# Patient Record
Sex: Male | Born: 1955 | Race: Black or African American | Hispanic: No | Marital: Married | Smoking: Current every day smoker
Health system: Southern US, Community
[De-identification: ages and names within clinical notes are randomized; demographics above are authoritative.]

## PROBLEM LIST (undated history)

## (undated) DIAGNOSIS — M2550 Pain in unspecified joint: Secondary | ICD-10-CM

## (undated) DIAGNOSIS — M1990 Unspecified osteoarthritis, unspecified site: Secondary | ICD-10-CM

## (undated) DIAGNOSIS — G4700 Insomnia, unspecified: Secondary | ICD-10-CM

## (undated) DIAGNOSIS — G8929 Other chronic pain: Secondary | ICD-10-CM

## (undated) DIAGNOSIS — N400 Enlarged prostate without lower urinary tract symptoms: Secondary | ICD-10-CM

## (undated) DIAGNOSIS — I10 Essential (primary) hypertension: Secondary | ICD-10-CM

## (undated) DIAGNOSIS — K219 Gastro-esophageal reflux disease without esophagitis: Secondary | ICD-10-CM

## (undated) DIAGNOSIS — M549 Dorsalgia, unspecified: Secondary | ICD-10-CM

## (undated) DIAGNOSIS — R351 Nocturia: Secondary | ICD-10-CM

## (undated) DIAGNOSIS — T4145XA Adverse effect of unspecified anesthetic, initial encounter: Secondary | ICD-10-CM

## (undated) DIAGNOSIS — T8859XA Other complications of anesthesia, initial encounter: Secondary | ICD-10-CM

## (undated) HISTORY — PX: BACK SURGERY: SHX140

## (undated) HISTORY — PX: HEMORRHOID SURGERY: SHX153

## (undated) HISTORY — PX: TONSILLECTOMY: SUR1361

## (undated) HISTORY — PX: COLONOSCOPY: SHX174

---

## 2009-09-17 ENCOUNTER — Emergency Department (HOSPITAL_COMMUNITY): Admission: EM | Admit: 2009-09-17 | Discharge: 2009-09-18 | Payer: Self-pay | Admitting: Emergency Medicine

## 2009-12-11 ENCOUNTER — Emergency Department (HOSPITAL_COMMUNITY): Admission: EM | Admit: 2009-12-11 | Discharge: 2009-12-11 | Payer: Self-pay | Admitting: Emergency Medicine

## 2010-07-08 LAB — URINE MICROSCOPIC-ADD ON

## 2010-07-08 LAB — URINE CULTURE
Colony Count: NO GROWTH
Culture  Setup Time: 201108211832
Culture: NO GROWTH

## 2010-07-08 LAB — URINALYSIS, ROUTINE W REFLEX MICROSCOPIC
Bilirubin Urine: NEGATIVE
Nitrite: NEGATIVE
Protein, ur: NEGATIVE mg/dL
Specific Gravity, Urine: 1.008 (ref 1.005–1.030)
pH: 6 (ref 5.0–8.0)

## 2010-07-17 ENCOUNTER — Emergency Department (HOSPITAL_COMMUNITY)
Admission: EM | Admit: 2010-07-17 | Discharge: 2010-07-17 | Disposition: A | Discharge status: Home / Self Care | Payer: Self-pay | Attending: Emergency Medicine | Admitting: Emergency Medicine

## 2010-07-17 DIAGNOSIS — K089 Disorder of teeth and supporting structures, unspecified: Secondary | ICD-10-CM | POA: Insufficient documentation

## 2010-07-17 DIAGNOSIS — R220 Localized swelling, mass and lump, head: Secondary | ICD-10-CM | POA: Insufficient documentation

## 2010-07-17 DIAGNOSIS — R21 Rash and other nonspecific skin eruption: Secondary | ICD-10-CM | POA: Insufficient documentation

## 2011-06-11 ENCOUNTER — Emergency Department (HOSPITAL_COMMUNITY)
Admission: EM | Admit: 2011-06-11 | Discharge: 2011-06-11 | Disposition: A | Discharge status: Home / Self Care | Payer: Self-pay | Attending: Emergency Medicine | Admitting: Emergency Medicine

## 2011-06-11 ENCOUNTER — Emergency Department (HOSPITAL_COMMUNITY): Payer: Self-pay

## 2011-06-11 ENCOUNTER — Encounter (HOSPITAL_COMMUNITY): Payer: Self-pay | Admitting: *Deleted

## 2011-06-11 DIAGNOSIS — F17200 Nicotine dependence, unspecified, uncomplicated: Secondary | ICD-10-CM | POA: Insufficient documentation

## 2011-06-11 DIAGNOSIS — M549 Dorsalgia, unspecified: Secondary | ICD-10-CM | POA: Insufficient documentation

## 2011-06-11 DIAGNOSIS — R209 Unspecified disturbances of skin sensation: Secondary | ICD-10-CM | POA: Insufficient documentation

## 2011-06-11 DIAGNOSIS — M5126 Other intervertebral disc displacement, lumbar region: Secondary | ICD-10-CM | POA: Insufficient documentation

## 2011-06-11 HISTORY — DX: Dorsalgia, unspecified: M54.9

## 2011-06-11 HISTORY — DX: Other chronic pain: G89.29

## 2011-06-11 MED ORDER — DIAZEPAM 5 MG/ML IJ SOLN
5.0000 mg | Freq: Once | INTRAMUSCULAR | Status: AC
  Administered 2011-06-11: 5 mg via INTRAMUSCULAR
  Filled 2011-06-11: qty 2

## 2011-06-11 MED ORDER — OXYCODONE-ACETAMINOPHEN 5-325 MG PO TABS: 1.0000 | ORAL_TABLET | ORAL | Status: AC | PRN

## 2011-06-11 MED ORDER — PREDNISONE 10 MG PO TABS: 40.0000 mg | ORAL_TABLET | Freq: Every day | ORAL | Status: DC

## 2011-06-11 MED ORDER — DIAZEPAM 5 MG PO TABS: 5.0000 mg | ORAL_TABLET | Freq: Two times a day (BID) | ORAL | Status: AC

## 2011-06-11 MED ORDER — DEXAMETHASONE SODIUM PHOSPHATE 10 MG/ML IJ SOLN
10.0000 mg | Freq: Once | INTRAMUSCULAR | Status: AC
  Administered 2011-06-11: 10 mg via INTRAMUSCULAR
  Filled 2011-06-11: qty 1

## 2011-06-11 MED ORDER — HYDROMORPHONE HCL PF 2 MG/ML IJ SOLN
2.0000 mg | Freq: Once | INTRAMUSCULAR | Status: AC
  Administered 2011-06-11: 2 mg via INTRAMUSCULAR
  Filled 2011-06-11: qty 1

## 2011-06-11 NOTE — ED Provider Notes (Signed)
Medical screening examination/treatment/procedure(s) were performed by non-physician practitioner and as supervising physician I was immediately available for consultation/collaboration.   Vida Roller, MD 06/11/11 1539

## 2011-06-11 NOTE — ED Notes (Signed)
Pt c/o chronic back pain, exacerbated by moving heavy furniture today. Pt presents w/ uncontrolled low back pain x 3 hrs.

## 2011-06-11 NOTE — ED Provider Notes (Signed)
History     CSN: [id number]  Arrival date & time 06/11/11  0114   First MD Initiated Contact with Patient 06/11/11 0121      Chief Complaint  Patient presents with  . Back Pain    (Consider location/radiation/quality/duration/timing/severity/associated sxs/prior treatment) HPI Comments: Patient here with lower back pain - states that he was moving furniture about 2 days ago - states that the pain started soon after this - states has prior history of lower back pain periodically in the past - denies any surgeries or interventions with this - reports pain radiates from left lower back into left thigh and stops at the knee - denies weakness but reports decrease in movement due to the pain - denies numbness but reports mild tingling to left thigh - denies loss of control of bowels or bladder   Patient is a 56 y.o. male presenting with back pain. The history is provided by the patient. No language interpreter was used.  Back Pain  This is a recurrent problem. The current episode started 2 days ago. The problem occurs constantly. The problem has not changed since onset.The pain is associated with lifting heavy objects. The pain is present in the lumbar spine. The quality of the pain is described as shooting. The pain radiates to the left thigh. The pain is at a severity of 10/10. The pain is severe. The symptoms are aggravated by bending and twisting. The pain is the same all the time. Stiffness is present all day. Associated symptoms include leg pain and tingling. Pertinent negatives include no chest pain, no fever, no numbness, no weight loss, no headaches, no abdominal pain, no abdominal swelling, no bowel incontinence, no perianal numbness, no bladder incontinence, no dysuria, no pelvic pain, no paresthesias, no paresis and no weakness. He has tried NSAIDs for the symptoms. The treatment provided no relief.    Past Medical History  Diagnosis Date  . Chronic back pain greater than 3 months  duration     Past Surgical History  Procedure Date  . Hemorrhoid surgery   . Tonsillectomy     History reviewed. No pertinent family history.  History  Substance Use Topics  . Smoking status: Current Everyday Smoker -- 0.5 packs/day    Types: Cigarettes  . Smokeless tobacco: Not on file  . Alcohol Use: Yes     occasionally      Review of Systems  Constitutional: Negative for fever and weight loss.  Cardiovascular: Negative for chest pain.  Gastrointestinal: Negative for abdominal pain and bowel incontinence.  Genitourinary: Negative for bladder incontinence, dysuria and pelvic pain.  Musculoskeletal: Positive for back pain.  Neurological: Positive for tingling. Negative for weakness, numbness, headaches and paresthesias.  All other systems reviewed and are negative.    Allergies  Review of patient's allergies indicates no known allergies.  Home Medications  No current outpatient prescriptions on file.  BP 144/89  Pulse 91  Temp(Src) 98.8 F (37.1 C) (Oral)  Resp 24  Ht 6\' 3"  (1.905 m)  Wt 275 lb (124.739 kg)  BMI 34.37 kg/m2  SpO2 100%  Physical Exam  Nursing note and vitals reviewed. Constitutional: He is oriented to person, place, and time. He appears well-developed and well-nourished. No distress.  HENT:  Head: Normocephalic and atraumatic.  Right Ear: External ear normal.  Left Ear: External ear normal.  Nose: Nose normal.  Mouth/Throat: Oropharynx is clear and moist. No oropharyngeal exudate.  Eyes: Conjunctivae are normal. Pupils are equal, round, and reactive to  light. No scleral icterus.  Neck: Normal range of motion. Neck supple.  Cardiovascular: Normal rate, regular rhythm and normal heart sounds.  Exam reveals no gallop and no friction rub.   No murmur heard. Pulmonary/Chest: Effort normal and breath sounds normal. No respiratory distress. He exhibits no tenderness.  Abdominal: Soft. Bowel sounds are normal. He exhibits no distension. There is  no tenderness.  Musculoskeletal:       Lumbar back: He exhibits decreased range of motion, tenderness and bony tenderness. He exhibits no deformity.       Back:  Lymphadenopathy:    He has no cervical adenopathy.  Neurological: He is alert and oriented to person, place, and time. He displays normal reflexes. No cranial nerve deficit. He exhibits normal muscle tone. Coordination normal.  Skin: Skin is warm and dry. No rash noted. No erythema. No pallor.  Psychiatric: He has a normal mood and affect. His behavior is normal. Judgment and thought content normal.    ED Course  Procedures (including critical care time)  Labs Reviewed - No data to display Ct Lumbar Spine Wo Contrast  06/11/2011  *RADIOLOGY REPORT*  Clinical Data: Chronic lower back pain, significantly worsened for past 3 hours.  CT LUMBAR SPINE WITHOUT CONTRAST  Technique:  Multidetector CT imaging of the lumbar spine was performed without intravenous contrast administration. Multiplanar CT image reconstructions were also generated.  Comparison: None.  Findings: There is no evidence of fracture or subluxation along the lumbar spine.  Vertebral bodies demonstrate normal height and alignment.  Intervertebral disc spaces are preserved. Facet disease is noted along the lower lumbar spine.  Mild circumferential disc bulges are noted at L3-L4, L4-L5 and L5- S1.  These result in mild partial effacement of the foraminal fat bilaterally at these levels, without evidence of impression on the exiting nerve roots.  There is mild bony foraminal narrowing on the left side at L4-L5, which may slightly impress on the exiting nerve root.  Suggest correlation for associated symptoms.  Mild scattered vascular calcifications are seen.  No significant soft tissue abnormalities are identified.  IMPRESSION:  1.  No evidence of fracture or subluxation along the lumbar spine. 2.  Mild circumferential disc bulges noted along the lower lumbar spine, without evidence  of impression on exiting nerve roots. 3.  Mild bony foraminal narrowing on the left side at L4-L5, which may slightly impress on the exiting nerve root.  Suggest correlation for associated symptoms.  Original Report Authenticated By: Tonia Ghent, M.D.     Herniated disc    MDM  CT reveals multilevel mild disc bulges without compression on nerve roots - however there is mild bony foraminal narrowing on the left side at L4-5 which may be causing compression on the exiting left nerve root consistent with the patient's symptoms.  Plan to continue on steroids, pain control and have the patient follow up with Dr. Wynetta Emery with [facility] on an outpatient basis.        Izola Price [place], [place] 06/11/11 [phone]

## 2011-06-11 NOTE — Discharge Instructions (Signed)
Herniated Disk The bones of your spinal column (vertebrae) protect your spinal cord and nerves that go into your arms and legs. The vertebrae are separated by disks that cushion the spinal column and put space between your vertebrae. This allows movement between the vertebrae, which allows you to bend, rotate, and move your body from side to side. Sometimes, the disks move out of place (herniate) or break open (rupture) from injury or strain. The most common area for a disk herniation is in the lower back (lumbar area). Sometimes herniation occurs in the neck (cervical) disks.  CAUSES  As we grow older, the strong, fibrous cords that connect the vertebrae and support and surround the disks (ligaments) start to weaken. A strain on the back may cause a break in the disk ligaments. RISK FACTORS Herniated disks occur most often in men who are aged 18 years to 35 years, usually after strenuous activity. Other risk factors include conditions present at birth (congenital) that affect the size of the lumbar spinal canal. Additionally, a narrowing of the areas where the nerves exit the spinal canal can occur as you age. SYMPTOMS  Symptoms of a herniated disk vary. You may have weakness in certain muscles. This weakness can include difficulty lifting your leg or arm, difficulty standing on your toes on one side, or difficulty squeezing tightly with one of your hands. You may have numbness. You may feel a mild tingling, dull ache, or a burning or pulsating pain. In some cases, the pain is severe enough that you are unable to move. The pain most often occurs on one side of the body. The pain often starts slowly. It may get worse:  After you sit or stand.   At night.   When you sneeze, cough, or laugh.   When you bend backwards or walk more than a few yards.  The pain, numbness, or weakness will often go away or improve a lot over a period of weeks to months. Herniated lumbar disk Symptoms of a herniated  lumbar disk may include sharp pain in one part of your leg, hip, or buttocks and numbness in other parts. You also may feel pain or numbness on the back of your calf or the top or sole of your foot. The same leg also may feel weak. Herniated cervical disk Symptoms of a herniated cervical disk may include pain when you move your neck, deep pain near or over your shoulder blade, or pain that moves to your upper arm, forearm, or fingers. DIAGNOSIS  To diagnose a herniated disk, your caregiver will perform a physical exam. Your caregiver also may perform diagnostic tests to see your disk or to test the reaction of your muscles and the function of your nerves. During the physical exam, your caregiver may ask you to:  Sit, stand, and walk. While you walk, your caregiver may ask you to try walking on your toes and then your heels.   Bend forward, backward, and sideways.   Raise your shoulders, elbow, wrist, and fingers and check your strength during these tasks.  Your caregiver will check for:  Numbness or loss of feeling.   Muscle reflexes, which may be slower or missing.   Muscle strength, which may be weaker.   Posture or the way your spine curves.  Diagnostic tests that may be done include:  A spinal X-ray exam to rule out other causes of back pain.   Magnetic resonance imaging (MRI) or computed tomography (CT) scan, which will show   if the herniated disk is pressing on your spinal canal.   Electromyography. This is sometimes used to identify the specific area of nerve involvement.  TREATMENT  Initial treatment for a herniated disk is a short period of rest with medicines for pain. Pain medicines can include nonsteroidal anti-inflammatory medicines (NSAIDs), muscle relaxants for back spasms, and (rarely) narcotic pain medicine for severe pain that does not respond to NSAID use. Bed rest is often limited to 1 or 2 days at the most because prolonged rest can delay recovery. When the herniation  involves the lower back, sitting should be avoided as much as possible because sitting increases pressure on the ruptured disk. Sometimes a soft neck collar will be prescribed for a few days to weeks to help support your neck in the case of a cervical herniation. Physical therapy is often prescribed for patients with disk disease. Physical therapists will teach you how to properly lift, dress, walk, and perform other activities. They will work on strengthening the muscles that help support your spine. In some cases, physical therapy alone is not enough to treat a herniated disk. Steroid injections along the involved nerve root may be needed to help control pain. The steroid is injected in the area of the herniated disk and helps by reducing swelling around the disk. Sometimes surgery is the best option to treat a herniated disk.  SEEK IMMEDIATE MEDICAL CARE IF:   You have numbness, tingling, weakness, or problems with the use of your arms or legs.   You have severe headaches that are not relieved with the use of medicines.   You notice a change in your bowel or bladder control.   You have increasing pain in any areas of your body.   You experience shortness of breath, dizziness, or fainting.  MAKE SURE YOU:   Understand these instructions.   Will watch your condition.   Will get help right away if you are not doing well or get worse.  Document Released: 04/07/2000 Document Revised: 12/21/2010 Document Reviewed: 11/11/2010 [employer] Patient Information 2012 [employer]. 

## 2011-10-14 ENCOUNTER — Emergency Department (HOSPITAL_COMMUNITY)
Admission: EM | Admit: 2011-10-14 | Discharge: 2011-10-14 | Disposition: A | Discharge status: Home / Self Care | Payer: Self-pay | Attending: Emergency Medicine | Admitting: Emergency Medicine

## 2011-10-14 ENCOUNTER — Encounter (HOSPITAL_COMMUNITY): Payer: Self-pay | Admitting: Emergency Medicine

## 2011-10-14 DIAGNOSIS — Z23 Encounter for immunization: Secondary | ICD-10-CM | POA: Insufficient documentation

## 2011-10-14 DIAGNOSIS — Y92009 Unspecified place in unspecified non-institutional (private) residence as the place of occurrence of the external cause: Secondary | ICD-10-CM | POA: Insufficient documentation

## 2011-10-14 DIAGNOSIS — S51819A Laceration without foreign body of unspecified forearm, initial encounter: Secondary | ICD-10-CM

## 2011-10-14 DIAGNOSIS — S51809A Unspecified open wound of unspecified forearm, initial encounter: Secondary | ICD-10-CM | POA: Insufficient documentation

## 2011-10-14 DIAGNOSIS — F17200 Nicotine dependence, unspecified, uncomplicated: Secondary | ICD-10-CM | POA: Insufficient documentation

## 2011-10-14 DIAGNOSIS — W268XXA Contact with other sharp object(s), not elsewhere classified, initial encounter: Secondary | ICD-10-CM | POA: Insufficient documentation

## 2011-10-14 MED ORDER — TETANUS-DIPHTH-ACELL PERTUSSIS 5-2.5-18.5 LF-MCG/0.5 IM SUSP
0.5000 mL | Freq: Once | INTRAMUSCULAR | Status: AC
  Administered 2011-10-14: 0.5 mL via INTRAMUSCULAR
  Filled 2011-10-14: qty 0.5

## 2011-10-14 NOTE — ED Provider Notes (Signed)
History     CSN: [id number]  Arrival date & time 10/14/11  8657   First MD Initiated Contact with Patient 10/14/11 2113      Chief Complaint  Patient presents with  . Laceration    HPI  History provided by the patient. Patient is a 56 year old male with no significant past medical history who presents with complaints of laceration to right forearm. Patient states that he was helping remove a broken mirror from his daughter's room. He states that mirror had a sharp edge the top and after he said down or lean against the bed he turned to grab other equipment and when he turned back he hit the sharp edge of the mirror with his right forearm. he denies any broken pieces of glass or mirror. Patient has small lacerations with associated bleeding. Bleeding was controlled with pressure. Patient denies any distal weakness or numbness in hand. Symptoms are described as mild to moderate. Patient is unsure of his last tetanus shot. Patient has no other complaints or associated symptoms.   History reviewed. No pertinent past medical history.  History reviewed. No pertinent past surgical history.  History reviewed. No pertinent family history.  History  Substance Use Topics  . Smoking status: Current Everyday Smoker  . Smokeless tobacco: Not on file  . Alcohol Use: Yes      Review of Systems  Skin:       Laceration of right forearm  Neurological: Negative for weakness and numbness.    Allergies  Review of patient's allergies indicates no known allergies.  Home Medications  No current outpatient prescriptions on file.  BP 134/90  Pulse 89  Temp 98.5 F (36.9 C) (Oral)  Resp 18  SpO2 98%  Physical Exam  Nursing note and vitals reviewed. Constitutional: He appears well-developed and well-nourished. No distress.  HENT:  Head: Normocephalic.  Cardiovascular: Normal rate and regular rhythm.   Pulmonary/Chest: Effort normal and breath sounds normal.  Musculoskeletal:   Normal distal sensations, grip strength, radial pulses and cap refill less than 2 seconds in right hand.  Neurological: He is alert.  Skin: Skin is warm.       1.5 cm laceration to lateral right forearm.  Psychiatric: He has a normal mood and affect. His behavior is normal.    ED Course  Procedures   LACERATION REPAIR Performed by: Angus Seller Authorized by: Angus Seller Consent: Verbal consent obtained. Risks and benefits: risks, benefits and alternatives were discussed Consent given by: patient Patient identity confirmed: provided demographic data Prepped and Draped in normal sterile fashion Wound explored  Laceration Location: Lateral right forearm  Laceration Length: 1.5 cm  No Foreign Bodies seen or palpated  Anesthesia: local infiltration  Local anesthetic: lidocaine 2 % with epinephrine  Anesthetic total: 3 ml  Irrigation method: syringe Amount of cleaning: standard  Skin closure: Skin with 3-0 Prolene   Number of sutures: 3   Technique: Simple interrupted   Patient tolerance: Patient tolerated the procedure well with no immediate complications.     1. Laceration of forearm       MDM  Patient seen and evaluated. Patient no acute distress.     Mirror  or glass.   Angus Seller, [place] 10/15/11 [phone]

## 2011-10-14 NOTE — ED Notes (Signed)
MD at bedside completing sutures

## 2011-10-14 NOTE — Discharge Instructions (Signed)
You were seen and treated for your laceration of your arm. This was cleaned with water and closed with sutures. You will need to have your sutures removed in 7-10 days. You may followup with your primary care provider, urgent care Center or return to emergency room to have your sutures removed. If you develop any increasing pain, swelling, redness, bleeding or drainage please return sooner.    Laceration Care, Adult A laceration is a cut or lesion that goes through all layers of the skin and into the tissue just beneath the skin. TREATMENT  Some lacerations may not require closure. Some lacerations may not be able to be closed due to an increased risk of infection. It is important to see your caregiver as soon as possible after an injury to minimize the risk of infection and maximize the opportunity for successful closure. If closure is appropriate, pain medicines may be given, if needed. The wound will be cleaned to help prevent infection. Your caregiver will use stitches (sutures), staples, wound glue (adhesive), or skin adhesive strips to repair the laceration. These tools bring the skin edges together to allow for faster healing and a better cosmetic outcome. However, all wounds will heal with a scar. Once the wound has healed, scarring can be minimized by covering the wound with sunscreen during the day for 1 full year. HOME CARE INSTRUCTIONS  For sutures or staples:  Keep the wound clean and dry.   If you were given a bandage (dressing), you should change it at least once a day. Also, change the dressing if it becomes wet or dirty, or as directed by your caregiver.   Wash the wound with soap and water 2 times a day. Rinse the wound off with water to remove all soap. Pat the wound dry with a clean towel.   After cleaning, apply a thin layer of the antibiotic ointment as recommended by your caregiver. This will help prevent infection and keep the dressing from sticking.   You may shower as  usual after the first 24 hours. Do not soak the wound in water until the sutures are removed.   Only take over-the-counter or prescription medicines for pain, discomfort, or fever as directed by your caregiver.   Get your sutures or staples removed as directed by your caregiver.  For skin adhesive strips:  Keep the wound clean and dry.   Do not get the skin adhesive strips wet. You may bathe carefully, using caution to keep the wound dry.   If the wound gets wet, pat it dry with a clean towel.   Skin adhesive strips will fall off on their own. You may trim the strips as the wound heals. Do not remove skin adhesive strips that are still stuck to the wound. They will fall off in time.  For wound adhesive:  You may briefly wet your wound in the shower or bath. Do not soak or scrub the wound. Do not swim. Avoid periods of heavy perspiration until the skin adhesive has fallen off on its own. After showering or bathing, gently pat the wound dry with a clean towel.   Do not apply liquid medicine, cream medicine, or ointment medicine to your wound while the skin adhesive is in place. This may loosen the film before your wound is healed.   If a dressing is placed over the wound, be careful not to apply tape directly over the skin adhesive. This may cause the adhesive to be pulled off before the wound  is healed.   Avoid prolonged exposure to sunlight or tanning lamps while the skin adhesive is in place. Exposure to ultraviolet light in the first year will darken the scar.   The skin adhesive will usually remain in place for 5 to 10 days, then naturally fall off the skin. Do not pick at the adhesive film.  You may need a tetanus shot if:  You cannot remember when you had your last tetanus shot.   You have never had a tetanus shot.  If you get a tetanus shot, your arm may swell, get red, and feel warm to the touch. This is common and not a problem. If you need a tetanus shot and you choose not to  have one, there is a rare chance of getting tetanus. Sickness from tetanus can be serious. SEEK MEDICAL CARE IF:   You have redness, swelling, or increasing pain in the wound.   You see a red line that goes away from the wound.   You have yellowish-white fluid (pus) coming from the wound.   You have a fever.   You notice a bad smell coming from the wound or dressing.   Your wound breaks open before or after sutures have been removed.   You notice something coming out of the wound such as wood or glass.   Your wound is on your hand or foot and you cannot move a finger or toe.  SEEK IMMEDIATE MEDICAL CARE IF:   Your pain is not controlled with prescribed medicine.   You have severe swelling around the wound causing pain and numbness or a change in color in your arm, hand, leg, or foot.   Your wound splits open and starts bleeding.   You have worsening numbness, weakness, or loss of function of any joint around or beyond the wound.   You develop painful lumps near the wound or on the skin anywhere on your body.  MAKE SURE YOU:   Understand these instructions.   Will watch your condition.   Will get help right away if you are not doing well or get worse.  Document Released: 04/10/2005 Document Revised: 03/30/2011 Document Reviewed: 10/04/2010 [facility] Patient Information 2012 [place], [place].    RESOURCE GUIDE  Chronic Pain Problems: Contact [facility]  [phone] Patients need to be referred by their primary care doctor.  Insufficient Money for Medicine: Contact [employer]:  call "211" or [facility] [phone].  No Primary Care Doctor: - Call Health Connect  [phone] - can help you locate a primary care doctor that  accepts your insurance, provides certain services, etc. - Physician Referral [phone]  Agencies that provide inexpensive medical care: - [facility]  [phone] - [facility]   [phone] - [facility]  [phone] [facility]  [phone] - Planned Parenthood  [phone] [facility]  [phone]  Medicaid-accepting [facility] Providers: - [facility]- [address]  [phone], Mon-Fri 9am-7pm, Sat 9am-1pm - [facility]- [address] [place], [address] [place]  [phone] - [facility]- [address] [place]  [phone] [facility]- [address]  [phone] - Renaye Rakers- [address] [place], [address], [phone]  Only accepts [place] patients after they have their name  applied to their card  Self Pay (no insurance) in [employer]: - Sickle Cell Patients:  Dr Willey Blade, [facility]  [address] [place], [phone] - [facility]- [address] [place]  [phone]       Patrcia Dolly [facility] [place]- [address]       -     [facility]- see information above (Speak to [employer] if you do not have insurance)       -  [facility]- [address] [place], [phone]       -  [facility] [place]- [address],  [phone]       -  [facility]- [address] [phone]       -  Dr Julio Sicks-  [address], [place], [phone]       -  [facility]- [address] [phone]       -  [facility]- [address] [phone], also [address] [phone]       -    [facility]- [address] [place], [phone], 1st & 3rd Saturday   every month, 10am-1pm  1) Find a Doctor and Pay Out of Pocket Although you won't have to find out who is covered by your insurance plan, it is a good idea to ask around and get recommendations. You will then need to call the office and see if the doctor you have chosen will accept you as a new patient and what types of options they offer for patients who are self-pay.  Some doctors offer discounts or will set up payment plans for their patients who do not have insurance, but you will need to ask so you aren't surprised when you get to your appointment.  2) Contact Your Local Health Department Not all health departments have doctors that can see patients for sick visits, but many do, so it is worth a call to see if yours does. If you don't know where your local health department is, you can check in your phone book. The CDC also has a tool to help you locate your state's health department, and many state websites also have listings of all of their local health departments.  3) Find a Walk-in Clinic If your illness is not likely to be very severe or complicated, you may want to try a walk in clinic. These are popping up all over the country in pharmacies, drugstores, and shopping centers. They're usually staffed by nurse practitioners or physician assistants that have been trained to treat common illnesses and complaints. They're usually fairly quick and inexpensive. However, if you have serious medical issues or chronic medical problems, these are probably not your best option  STD Testing - [facility] of [facility], STD Clinic, [address] [place], phone [phone] or [phone].  Monday - Friday, call for an appointment. [facility] of [employer], STD Clinic, [address], [place], phone [phone] or [phone].  Monday - Friday, call for an appointment.  Abuse/Neglect: [facility] Child Abuse Hotline (920)039-9802 [facility] Child Abuse Hotline 2127294103 (After Hours)  Emergency Shelter:  [facility] (270)723-8950  Maternity Homes: - [facility] 718-572-7194 - [facility] [phone]  MRSA Hotline #:   602-240-8016  [facility]  [facility] Dept. [address]                 [address]         [address] [place] [address]  Blondell Reveal Phone:  [phone]                                  Phone:  [phone]                   Phone:  [phone]  [facility], [phone] - [phone]       -     [facility] in [place], [address]                                  [phone], [facility] Child Abuse Hotline 856-368-8933 or [phone] (After Hours)   Behavioral Health Services  Substance Abuse Resources: - [facility]  705-060-1058 - [facility] [phone] - [facility] ([phone]) [phone] Floydene Flock (712)[phone] - Residential & Outpatient Substance Abuse Program  8033397969  Psychological Services: [facility]  [phone] Services  [phone] - [facility], [phone] [place]. [address] [place], ACCESS LINE: 838-749-8244 or (508)439-6111, [employer]  Dental Assistance  If unable to pay or uninsured, contact:  [facility] or [facility]. to become qualified for the adult dental clinic.  Patients with Medicaid: [facility] (307)[phone] [address], ([phone] [phone]  If unable to pay, or uninsured, contact [facility] 6620797896) or [facility] [phone] in [place], [phone] in [facility]) to become qualified for the adult dental clinic  Other Low-Cost Community Dental Services: - [facility]- [address] [place], [place], [place], [address], [phone], [address]. [address], 2nd and 4th Thursday of the month at 6:30am.  10 clients each day by appointment, can sometimes see walk-in patients if someone does not show for  an appointment. [facility]- [address] [place], [place], [address], [phone] - [facility]- [address], [place], [place], [address], [phone] - [facility]- [phone] [facility]- [phone] [facility] Department- [phone]

## 2011-10-14 NOTE — ED Notes (Signed)
Pt reports cut right forearm with broken mirror. Pt unsure of last tetanus.

## 2011-10-15 NOTE — ED Provider Notes (Signed)
Medical screening examination/treatment/procedure(s) were performed by non-physician practitioner and as supervising physician I was immediately available for consultation/collaboration.  Raseel Jans M Arnita Koons, MD 10/15/11 1250 

## 2011-10-17 ENCOUNTER — Encounter (HOSPITAL_COMMUNITY): Payer: Self-pay | Admitting: *Deleted

## 2013-11-01 ENCOUNTER — Encounter (HOSPITAL_COMMUNITY): Payer: Self-pay | Admitting: Emergency Medicine

## 2013-11-01 ENCOUNTER — Emergency Department (HOSPITAL_COMMUNITY)
Admission: EM | Admit: 2013-11-01 | Discharge: 2013-11-01 | Disposition: A | Discharge status: Home / Self Care | Payer: Medicaid Other | Attending: Emergency Medicine | Admitting: Emergency Medicine

## 2013-11-01 DIAGNOSIS — M545 Low back pain, unspecified: Secondary | ICD-10-CM | POA: Diagnosis not present

## 2013-11-01 DIAGNOSIS — M5126 Other intervertebral disc displacement, lumbar region: Secondary | ICD-10-CM | POA: Insufficient documentation

## 2013-11-01 DIAGNOSIS — IMO0002 Reserved for concepts with insufficient information to code with codable children: Secondary | ICD-10-CM | POA: Diagnosis not present

## 2013-11-01 DIAGNOSIS — G8929 Other chronic pain: Secondary | ICD-10-CM | POA: Diagnosis not present

## 2013-11-01 DIAGNOSIS — Z79899 Other long term (current) drug therapy: Secondary | ICD-10-CM | POA: Diagnosis not present

## 2013-11-01 DIAGNOSIS — F17200 Nicotine dependence, unspecified, uncomplicated: Secondary | ICD-10-CM | POA: Diagnosis not present

## 2013-11-01 DIAGNOSIS — M549 Dorsalgia, unspecified: Secondary | ICD-10-CM | POA: Diagnosis present

## 2013-11-01 DIAGNOSIS — Z8739 Personal history of other diseases of the musculoskeletal system and connective tissue: Secondary | ICD-10-CM

## 2013-11-01 MED ORDER — PREDNISONE 10 MG PO TABS: 20.0000 mg | ORAL_TABLET | Freq: Every day | ORAL | Status: DC

## 2013-11-01 MED ORDER — OXYCODONE-ACETAMINOPHEN 5-325 MG PO TABS: 2.0000 | ORAL_TABLET | Freq: Four times a day (QID) | ORAL | Status: DC | PRN

## 2013-11-01 MED ORDER — DIAZEPAM 5 MG PO TABS
5.0000 mg | ORAL_TABLET | Freq: Once | ORAL | Status: AC
  Administered 2013-11-01: 5 mg via ORAL
  Filled 2013-11-01: qty 1

## 2013-11-01 MED ORDER — DIAZEPAM 5 MG PO TABS: 5.0000 mg | ORAL_TABLET | Freq: Two times a day (BID) | ORAL | Status: DC

## 2013-11-01 MED ORDER — PREDNISONE 20 MG PO TABS
60.0000 mg | ORAL_TABLET | Freq: Once | ORAL | Status: AC
  Administered 2013-11-01: 60 mg via ORAL
  Filled 2013-11-01: qty 3

## 2013-11-01 MED ORDER — PREDNISONE 20 MG PO TABS: 40.0000 mg | ORAL_TABLET | Freq: Every day | ORAL | Status: DC

## 2013-11-01 MED ORDER — OXYCODONE-ACETAMINOPHEN 5-325 MG PO TABS
2.0000 | ORAL_TABLET | Freq: Once | ORAL | Status: AC
  Administered 2013-11-01: 2 via ORAL
  Filled 2013-11-01: qty 2

## 2013-11-01 NOTE — ED Provider Notes (Signed)
CSN: [id number]     Arrival date & time 11/01/13  1634 History  This chart was scribed for non-physician practitioner working with Craig Coleman Payor, MD, by Craig Coleman, ED Scribe. This patient was seen in room WTR5/WTR5 and the patient's care was started at 5:45 PM.    Chief Complaint  Patient presents with  . Back Pain     The history is provided by the patient. No language interpreter was used.   HPI Comments: Craig Coleman is a 58 y.o. male with a h/o chronic back pain who presents to the Emergency Department complaining of constant, gradually worsening, lower back pain for the past week. Patient states the pain became worse yesterday. Patient states he has been previously diagnosed with a bulging disc. He states that yesterday morning when he got out of bed he felt like he couldn't move and the pain brought tears to his eyes. Patient reports that the pain radiates to his bilateral hips. Pain is exacerbated by ambulation and twisting movements. Patient is on disability and will not be able to go see a pain doctor until January 2016 due to insurance. He admits to being a current every day smoker, 0.5 packs a day. He denies any urinary incontinence, bowel incontinence, or fevers.    Past Medical History  Diagnosis Date  . Chronic back pain greater than 3 months duration    Past Surgical History  Procedure Laterality Date  . Hemorrhoid surgery    . Tonsillectomy     No family history on file. History  Substance Use Topics  . Smoking status: Current Every Day Smoker -- 0.50 packs/day    Types: Cigarettes  . Smokeless tobacco: Not on file  . Alcohol Use: Yes     Comment: occasionally    Review of Systems  Constitutional: Negative for fever.  Gastrointestinal:       Bowel incontinence  Genitourinary:       Urinary incontinence  Musculoskeletal: Positive for back pain.  All other systems reviewed and are negative.     Allergies  Review of patient's allergies  indicates no known allergies.  Home Medications   Prior to Admission medications   Medication Sig Start Date End Date Taking? Authorizing Provider  diazepam (VALIUM) 5 MG tablet Take 1 tablet (5 mg total) by mouth 2 (two) times daily. 11/01/13   Craig Finner, PA-C  oxyCODONE-acetaminophen (PERCOCET/ROXICET) 5-325 MG per tablet Take 2 tablets by mouth every 6 (six) hours as needed for moderate pain or severe pain. 11/01/13   Craig Finner, PA-C  predniSONE (DELTASONE) 20 MG tablet Take 2 tablets (40 mg total) by mouth daily. 11/01/13   Craig Finner, PA-C   Triage Vitals: BP 144/79  Pulse 88  Temp(Src) 97.5 F (36.4 C) (Oral)  Resp 17  SpO2 97%  Physical Exam  Nursing note and vitals reviewed. Constitutional: He is oriented to person, place, and time. He appears well-developed and well-nourished.  Sitting uncomfortable in exam chair  HENT:  Head: Normocephalic and atraumatic.  Eyes: EOM are normal.  Neck: Normal range of motion.  Cardiovascular: Normal rate.   Pulmonary/Chest: Effort normal.  Musculoskeletal: Normal range of motion.  FROM all extremities. Tenderness to lumbar musculature. No midline spinal tenderness. Antalgic gait. 5/5 strength in all major muscle groups.  Neurological: He is alert and oriented to person, place, and time.  Reflex Scores:      Patellar reflexes are 2+ on the right side and 2+ on the left side. Reflexes intact. Sensations  intact  Skin: Skin is warm and dry.  Psychiatric: He has a normal mood and affect. His behavior is normal.    ED Course  Procedures (including critical care time)  DIAGNOSTIC STUDIES: Oxygen Saturation is 97% on RA, normal by my interpretation.    COORDINATION OF CARE: 5:53 PM- Will order Deltasone, Percocet, and Valium.  Pt advised of plan for treatment and pt agrees.    Labs Review Labs Reviewed - No data to display  Imaging Review No results found.   EKG Interpretation None      MDM   Final diagnoses:   Bilateral low back pain without sciatica  History of herniated intervertebral disc    Pt with hx of herniated discs c/o lower back pain, worsening over last 1 week. No red flag symptoms. No midline spinal tenderness.  Will tx as muscular pain.  Pain medication given in ED as pt's wife driving him home.  Rx: prednisone, percocet, valium. Ad[facility] as well as provided pt information for Dr. Newell CoralNudelman, neurosurgery, as pt reports being referred to pain clinic last year but still working on getting proper insurance.  Return precautions provided. Pt verbalized understanding and agreement with tx plan.   I personally performed the services described in this documentation, which was scribed in my presence. The recorded information has been reviewed and is accurate.     Craig Finnerrin O'Malley, PA-C 11/01/13 1836

## 2013-11-01 NOTE — ED Notes (Signed)
Pt states hx of bulging discs.  Pt states that it has been hurting worse x 1 wk and increasingly worse yesterday.

## 2013-11-02 NOTE — ED Provider Notes (Signed)
Medical screening examination/treatment/procedure(s) were performed by non-physician practitioner and as supervising physician I was immediately available for consultation/collaboration.   EKG Interpretation None       Juliet RudeNathan R. Rubin PayorPickering, MD 11/02/13 [id number]

## 2014-02-26 ENCOUNTER — Encounter (HOSPITAL_COMMUNITY): Payer: Self-pay | Admitting: Emergency Medicine

## 2014-02-26 ENCOUNTER — Emergency Department (HOSPITAL_COMMUNITY)
Admission: EM | Admit: 2014-02-26 | Discharge: 2014-02-27 | Disposition: A | Discharge status: Home / Self Care | Payer: Medicaid Other | Attending: Emergency Medicine | Admitting: Emergency Medicine

## 2014-02-26 DIAGNOSIS — R05 Cough: Secondary | ICD-10-CM | POA: Insufficient documentation

## 2014-02-26 DIAGNOSIS — Z720 Tobacco use: Secondary | ICD-10-CM | POA: Insufficient documentation

## 2014-02-26 DIAGNOSIS — Z79899 Other long term (current) drug therapy: Secondary | ICD-10-CM | POA: Diagnosis not present

## 2014-02-26 DIAGNOSIS — G8929 Other chronic pain: Secondary | ICD-10-CM | POA: Insufficient documentation

## 2014-02-26 DIAGNOSIS — Z7952 Long term (current) use of systemic steroids: Secondary | ICD-10-CM | POA: Diagnosis not present

## 2014-02-26 DIAGNOSIS — R0981 Nasal congestion: Secondary | ICD-10-CM | POA: Insufficient documentation

## 2014-02-26 DIAGNOSIS — H578 Other specified disorders of eye and adnexa: Secondary | ICD-10-CM | POA: Diagnosis present

## 2014-02-26 DIAGNOSIS — H109 Unspecified conjunctivitis: Secondary | ICD-10-CM | POA: Insufficient documentation

## 2014-02-26 NOTE — ED Notes (Signed)
Pt. reports bilateral eye pain with redness and drainage onset yesterday , no blurred vision , no fever or chills.

## 2014-02-27 MED ORDER — FLUORESCEIN SODIUM 1 MG OP STRP
1.0000 | ORAL_STRIP | Freq: Once | OPHTHALMIC | Status: AC
  Administered 2014-02-27: 1 via OPHTHALMIC
  Filled 2014-02-27: qty 1

## 2014-02-27 MED ORDER — ERYTHROMYCIN 5 MG/GM OP OINT: TOPICAL_OINTMENT | OPHTHALMIC | Status: DC

## 2014-02-27 MED ORDER — TETRACAINE HCL 0.5 % OP SOLN
2.0000 [drp] | Freq: Once | OPHTHALMIC | Status: AC
  Administered 2014-02-27: 2 [drp] via OPHTHALMIC
  Filled 2014-02-27: qty 2

## 2014-02-27 NOTE — Discharge Instructions (Signed)
Please call your doctor for a followup appointment within 24-48 hours. When you talk to your doctor please let them know that you were seen in the emergency department and have them acquire all of your records so that they can discuss the findings with you and formulate a treatment plan to fully care for your new and ongoing problems. Please call and set-up an appointment with your primary care provider Please rest and stay hydrated Please use antibiotics as prescribed  Please wash each pillowcase, towel, rag used to the face Please apply cool compressions Please call and set up an appointment with ophthalmologist, eye physician Please continue to monitor symptoms closely and if symptoms are to worsen or change (fever greater than 101, chills, sweating, nausea, vomiting, chest pain, shortness of breathe, difficulty breathing, weakness, numbness, tingling, worsening or changes to pain pattern, Eye swelling,, injury, swelling to the face) please report back to the Emergency Department immediately.    Conjunctivitis Conjunctivitis is commonly called "pink eye." Conjunctivitis can be caused by bacterial or viral infection, allergies, or injuries. There is usually redness of the lining of the eye, itching, discomfort, and sometimes discharge. There may be deposits of matter along the eyelids. A viral infection usually causes a watery discharge, while a bacterial infection causes a yellowish, thick discharge. Pink eye is very contagious and spreads by direct contact. You may be given antibiotic eyedrops as part of your treatment. Before using your eye medicine, remove all drainage from the eye by washing gently with warm water and cotton balls. Continue to use the medication until you have awakened 2 mornings in a row without discharge from the eye. Do not rub your eye. This increases the irritation and helps spread infection. Use separate towels from other household members. Wash your hands with soap and  water before and after touching your eyes. Use cold compresses to reduce pain and sunglasses to relieve irritation from light. Do not wear contact lenses or wear eye makeup until the infection is gone. SEEK MEDICAL CARE IF:   Your symptoms are not better after 3 days of treatment.  You have increased pain or trouble seeing.  The outer eyelids become very red or swollen. Document Released: 05/18/2004 Document Revised: 07/03/2011 Document Reviewed: 04/10/2005 [facility] Patient Information 2015 [place], [place]. This information is not intended to replace advice given to you by your health care provider. Make sure you discuss any questions you have with your health care provider.   Emergency Department Resource Guide 1) Find a Doctor and Pay Out of Pocket Although you won't have to find out who is covered by your insurance plan, it is a good idea to ask around and get recommendations. You will then need to call the office and see if the doctor you have chosen will accept you as a new patient and what types of options they offer for patients who are self-pay. Some doctors offer discounts or will set up payment plans for their patients who do not have insurance, but you will need to ask so you aren't surprised when you get to your appointment.  2) Contact Your Local Health Department Not all health departments have doctors that can see patients for sick visits, but many do, so it is worth a call to see if yours does. If you don't know where your local health department is, you can check in your phone book. The CDC also has a tool to help you locate your state's health department, and many state websites also  have listings of all of their local health departments.  3) Find a Walk-in Clinic If your illness is not likely to be very severe or complicated, you may want to try a walk in clinic. These are popping up all over the country in pharmacies, drugstores, and shopping centers. They're usually staffed  by nurse practitioners or physician assistants that have been trained to treat common illnesses and complaints. They're usually fairly quick and inexpensive. However, if you have serious medical issues or chronic medical problems, these are probably not your best option.  No Primary Care Doctor: - Call Health Connect at  [phone] - they can help you locate a primary care doctor that  accepts your insurance, provides certain services, etc. - Physician Referral Service- [phone]  Chronic Pain Problems: Organization         Address  Phone   Notes  [facility]  [phone] Patients need to be referred by their primary care doctor.   Medication Assistance: Organization         Address  Phone   Notes  [facility] Medication [facility] [address] [place], [place] [address] [phone] --Must be a resident of [facility] [place] -- Must have NO insurance coverage whatsoever (no Medicaid/ Medicare, etc.) -- The pt. MUST have a primary care doctor that directs their care regularly and follows them in the community   MedAssist  [phone]   [employer]  [phone]    Agencies that provide inexpensive medical care: Organization         Address  Phone   Notes  [facility]  [phone]   [facility]    [phone]) [phone]   [facility] [address] [place], [place] [address] [phone]   [facility] [address], [place] [phone]   Planned Parenthood    [phone]   [facility]    [phone]) [phone]   [facility]  [address], [place] Phone:  [phone]) [phone], Fax:  [phone] Hours of Operation:  9 am - 6 pm, M-F.  Also accepts Medicaid/Medicare and self-pay.  [facility]  [address], [place] Phone: [phone]) [phone], Fax: [phone]. Hours of Operation:   8:30 am - 5:30 pm, M-F.  Also accepts Medicaid and self-pay.  [facility] [place] [address], [place] Phone: [phone]   [facility] [address], [place], [place] [phone], Ext. [phone] Mondays & Thursdays: 7-9 AM.  First 15 patients are seen on a first come, first serve basis.    Medicaid-accepting [facility] Providers:  Organization         Address  Phone   Notes  [facility] [address], [place] [phone] Also accepts self-pay patients.  [facility] [address], [place]  [phone]   [facility] [address], [place] [phone]   [facility] [address] [place] [address], [place] [phone]   Renaye RakersVeita Bland [address], [place]   [phone] Only accepts [place] Access [place] patients after they have their name applied to their card.   Self-Pay (no insurance) in [facility]:  [employer]ganization         Address  Phone  Notes  Sickle Cell Patients, [facility] [address] [place], [place] [phone]   [facility] [address] [place], [place] [phone]   [facility] [place]  [address], [place] [phone]   [facility]/Dr. Osei-Bonsu  [address], [place] or [address], [place] [phone] Phone number for both [place] and [place] locations is the same.  Urgent Medical and [facility] [address], [place] [phone]   [facility] [address] [place] or [address] [phone]   [facility] [address], [place] [phone], phone; [phone], fax Sees patients 1st and 3rd Saturday of every month.  Must not qualify for public or private insurance (i.e. Medicaid, Medicare, [place] Health  Choice, Veterans' Benefits)  Household income should be no more than 200% of the poverty level The clinic cannot treat you if you are pregnant or think you are pregnant  Sexually transmitted diseases are not treated at the clinic.    Dental Care: Organization         Address  Phone  Notes  [facility] [address] [place], [place] [phone] Accepts children up to age 79 who are enrolled in [place] or [place] Health Choice; pregnant women with a Medicaid card; and children who have applied for Medicaid or [place] Health Choice, but were declined, whose parents can pay a reduced fee at time of service.  [facility] of [facility]  [address], [place] [phone] Accepts children up to age 50 who are enrolled in [place] or [place] Health Choice; pregnant women with a Medicaid card; and children who have applied for Medicaid or [place] Health Choice, but were declined, whose parents can pay a reduced fee at time of service.  [facility]  [address] [place], [place] [phone] Patients are seen by appointment only. Walk-ins are not accepted. [facility] will see patients 73 years of age and older. Monday - Tuesday (8am-5pm) Most Wednesdays (8:30-5pm) $30 per visit, cash only  [facility]  [address], [facility] [phone] Patients are seen by appointment only. Walk-ins are not accepted. [facility] will see patients 65 years of age and older. One Wednesday Evening (Monthly: Volunteer Based).  $30 per visit, cash only  [employer]  [phone] for adults; Children under age 51, call Graduate Pediatric Dentistry at [phone]. Children aged 5-14, please call [phone] to request a pediatric application.  Dental services are provided in all areas of dental care including fillings, crowns and bridges, complete  and partial dentures, implants, gum treatment, root canals, and extractions. Preventive care is also provided. Treatment is provided to both adults and children. Patients are selected via a lottery and there is often a waiting list.   [facility] [address], [place]  [phone] [email or web address]   [facility] [address] [place], [place] [phone], Ext. [phone] Second and Fourth Thursday of each month, opens at 6:30 AM; Clinic ends at 9 AM.  Patients are seen on a first-come first-served basis, and a limited number are seen during each clinic.   [facility]  [address] [place], [place] [phone]  Eligibility Requirements You must have lived in [place], [place], or [place] counties for at least the last three months.   You cannot be eligible for state or federal sponsored [employer], including [employer], [place], or [employer].   You generally cannot be eligible for healthcare insurance through your employer.    How to apply: Eligibility screenings are held every Tuesday and Wednesday afternoon from 1:00 pm until 4:00 pm. You do not need an appointment for the interview!  [facility] [address], [place], [place]   [place]  [place] Department  [place]  [phone]    Behavioral Health Resources in the Community: Intensive Outpatient Programs Organization         Address  Phone  Notes  [place]. [address] [place], [place] [phone]   [facility] [address], [place], [place]   ADS: Alcohol & Drug Svcs [address], [place], [place]   [place] [address]  [place], [place] or [phone]   Substance Abuse Resources Organization          Address  Phone  Notes  Al[facility] Drug Services  [phone]   [place]  [phone]   The [place]   Chinita Pester  [phone]   Residential & Outpatient Substance Abuse Program  [phone]   Psychological Services Organization         Address  Phone  Notes  [facility]  [place]  [phone]   [place] [address], [place] or [phone]    Mobile Crisis Teams Organization         Address  Phone  Notes  [facility], Mobile Crisis Care Unit  [phone]   [facility]  [address] [place], [place]   [place] Levels [address], [place] [phone]    Self-Help/Support Groups Organization         Address  Phone             Notes  [place]. of [place] - variety of support groups  [place] Call for more information  Narcotics Anonymous (NA), [facility] [address], [employer]  2 meetings at this location   Special educational needs teacher         Address  Phone  Notes  [facility] [place],    [place]  [phone]   [facility]  [address], [place] [address]37342, [place], [place]   [place], [place] [phone] Admissions: 8am-3pm M-F  Incentives Substance [place] [address]    [place], [place] [phone]   [facility] [address] Jadene Pierini [place], [place]   [facility] [address]  [place], [place]   [facility] - Intensive Outpatient [address], [place], [place]   [facility] ([place] [place].,  [place], [place] or [phone]   [facility] (RTS) [address], [place], [place] Accepts Medicaid  [facility] [place] [address]  [place] [phone] Substance Abuse/Addiction Treatment   [facility] Resources Organization  Address  Phone  Notes  °[facility]  [phone]   °Julie Brannon, PhD [address] [place], [place]   [phone] or [phone]   ° Behavioral   [address] °[place], [employer] [phone]   °[facility] [address], [place], [place] [phone] Insurance/Medicaid/sponsorship through [facility]  °[facility] [address]                                    [place], [place] [phone] Therapy/tele-psych/case  °[facility] [address]  ° [place], [place] [phone]    °Dr. Arfeen  [phone]   °[facility] [place]  [employer] [place] [facility] 1) [address],  °2) [address], [place] °3)  [address], [place] [phone] °([phone] ° °([phone]) [phone]   °[place] Child Abuse Hotline (336) [phone] or ([phone]) [phone] (After Hours)    ° ° ° ° °

## 2014-02-27 NOTE — ED Provider Notes (Signed)
CSN: [id number]     Arrival date & time 02/26/14  2235 History   First MD Initiated Contact with Patient 02/26/14 2353     Chief Complaint  Patient presents with  . Eye Pain     (Consider location/radiation/quality/duration/timing/severity/associated sxs/prior Treatment) The history is provided by the patient. No language interpreter was used.  Leone BrandRonald Duling is a 58 year old male with no static and past medical history presenting to the emergency department with bilateral eye redness and discharge that have been ongoing for approximately 3-4 days. Patient reported that the redness to the eye and discharge the eye started in the right eye has worked its way to the left eye. Reported that bilateral eyes itch and when he itches the eyes and burning. Reported increased tearing bilaterally. Reported that he's been having associated symptoms of cough and nasal congestion. Stated that on Saturday he used [employer] cleaning supplies-reported that he did wipe his eyes with his hands-denied sudden onset of pain or burning sensation. Denied blurred vision, sudden loss of vision, trauma, neck pain, fever, chills, eye pain, pain with motion to the eye. PCP none  Past Medical History  Diagnosis Date  . Chronic back pain greater than 3 months duration    Past Surgical History  Procedure Laterality Date  . Hemorrhoid surgery    . Tonsillectomy     No family history on file. History  Substance Use Topics  . Smoking status: Current Every Day Smoker -- 0.50 packs/day    Types: Cigarettes  . Smokeless tobacco: Not on file  . Alcohol Use: Yes     Comment: occasionally    Review of Systems  Constitutional: Negative for fever and chills.  HENT: Positive for congestion.   Eyes: Positive for discharge, redness and itching. Negative for photophobia, pain and visual disturbance.  Respiratory: Positive for cough. Negative for chest tightness.   Cardiovascular: Negative for chest pain.  Musculoskeletal:  Negative for neck pain and neck stiffness.  Neurological: Negative for dizziness and headaches.      Allergies  Review of patient's allergies indicates no known allergies.  Home Medications   Prior to Admission medications   Medication Sig Start Date End Date Taking? Authorizing Provider  diazepam (VALIUM) 5 MG tablet Take 1 tablet (5 mg total) by mouth 2 (two) times daily. 11/01/13   Junius FinnerErin O'Malley, PA-C  erythromycin ophthalmic ointment Place a 1/2 inch ribbon of ointment into the lower eyelids bilaterally four times per day for 7 days. 02/27/14   Kristeen Lantz, PA-C  oxyCODONE-acetaminophen (PERCOCET/ROXICET) 5-325 MG per tablet Take 2 tablets by mouth every 6 (six) hours as needed for moderate pain or severe pain. 11/01/13   Junius FinnerErin O'Malley, PA-C  predniSONE (DELTASONE) 20 MG tablet Take 2 tablets (40 mg total) by mouth daily. 11/01/13   Junius FinnerErin O'Malley, PA-C   BP 127/86 mmHg  Pulse 86  Temp(Src) 98.5 F (36.9 C) (Oral)  Resp 20  Ht 6\' 3"  (1.905 m)  Wt 256 lb (116.121 kg)  BMI 32.00 kg/m2  SpO2 98% Physical Exam  Constitutional: He is oriented to person, place, and time. He appears well-developed and well-nourished. No distress.  HENT:  Head: Normocephalic and atraumatic.  Eyes: EOM and lids are normal. Pupils are equal, round, and reactive to light. Right eye exhibits no chemosis, no discharge, no exudate and no hordeolum. No foreign body present in the right eye. Left eye exhibits no chemosis, no discharge, no exudate and no hordeolum. No foreign body present in the left  eye. Right conjunctiva is injected. Left conjunctiva is injected. Right eye exhibits normal extraocular motion and no nystagmus. Left eye exhibits normal extraocular motion and no nystagmus. Right pupil is round and reactive. Left pupil is round and reactive.  Slit lamp exam:      The right eye shows no corneal abrasion, no corneal flare, no corneal ulcer, no foreign body, no fluorescein uptake and no anterior chamber  bulge.       The left eye shows no corneal abrasion, no corneal flare, no corneal ulcer and no foreign body.  Neck: Normal range of motion. Neck supple. No tracheal deviation present.  Negative neck stiffness Negative nuchal rigidity Negative cervical lymphadenopathy Negative meningeal signs  Cardiovascular: Normal rate, regular rhythm and normal heart sounds.  Exam reveals no friction rub.   No murmur heard. Pulmonary/Chest: Effort normal and breath sounds normal. No respiratory distress. He has no wheezes. He has no rales.  Musculoskeletal: Normal range of motion.  Lymphadenopathy:    He has no cervical adenopathy.  Neurological: He is alert and oriented to person, place, and time. No cranial nerve deficit. He exhibits normal muscle tone. Coordination normal.  Skin: Skin is warm and dry. No rash noted. He is not diaphoretic. No erythema.  Psychiatric: He has a normal mood and affect. His behavior is normal. Thought content normal.  Nursing note and vitals reviewed.   ED Course  Procedures (including critical care time) Labs Review Labs Reviewed - No data to display  Imaging Review No results found.   EKG Interpretation None        Visual Acuity  Right Eye Distance: 20/30 Left Eye Distance: 20/20 Bilateral Distance:    Right Eye Near:   Left Eye Near:    Bilateral Near:   (charted on wrong patient)   MDM   Final diagnoses:  Bilateral conjunctivitis    Medications  tetracaine (PONTOCAINE) 0.5 % ophthalmic solution 2 drop (2 drops Right Eye Given 02/27/14 0105)  fluorescein ophthalmic strip 1 strip (1 strip Both Eyes Given 02/27/14 0105)    Filed Vitals:   02/26/14 2250 02/27/14 0129  BP: 127/86   Pulse:  86  Temp: 98.5 F (36.9 C)   TempSrc: Oral   Resp: 20   Height: 6\' 3"  (1.905 m)   Weight: 256 lb (116.121 kg)   SpO2: 98%    Doubt herpeptic keratosis. Doubt chemosis. Doubt chemical burn. Doubt shingles. Doubt dacryocystitis. Suspicion to be bilateral  conjunctivitis. Visual acuity unremarkable. Physical exam unremarkable. Patient stable, afebrile. Patient not septic appearing. Discharged patient. Discharge patient with erythromycin. Discussed with patient to rest and stay hydrated. Discussed with patient of Casimiro NeedleMichael compressions. Referred patient to health and wellness ophthalmologist.Discussed with patient to closely monitor symptoms and if symptoms are to worsen or change to report back to the ED - strict return instructions given.  Patient agreed to plan of care, understood, all questions answered.    Raymon MuttonMarissa Ruffin Lada, PA-C 02/27/14 0130  Flint MelterElliott L [place], MD 02/27/14 [phone]

## 2014-04-08 ENCOUNTER — Encounter (HOSPITAL_COMMUNITY): Payer: Self-pay | Admitting: Emergency Medicine

## 2014-04-08 ENCOUNTER — Emergency Department (HOSPITAL_COMMUNITY)
Admission: EM | Admit: 2014-04-08 | Discharge: 2014-04-08 | Disposition: A | Discharge status: Home / Self Care | Payer: Medicare Other | Attending: Emergency Medicine | Admitting: Emergency Medicine

## 2014-04-08 DIAGNOSIS — J0100 Acute maxillary sinusitis, unspecified: Secondary | ICD-10-CM | POA: Insufficient documentation

## 2014-04-08 DIAGNOSIS — G8929 Other chronic pain: Secondary | ICD-10-CM | POA: Insufficient documentation

## 2014-04-08 DIAGNOSIS — Z720 Tobacco use: Secondary | ICD-10-CM | POA: Insufficient documentation

## 2014-04-08 MED ORDER — AMOXICILLIN 500 MG PO CAPS
500.0000 mg | ORAL_CAPSULE | Freq: Once | ORAL | Status: AC
  Administered 2014-04-08: 500 mg via ORAL
  Filled 2014-04-08: qty 1

## 2014-04-08 MED ORDER — HYDROCODONE-ACETAMINOPHEN 5-325 MG PO TABS: 2.0000 | ORAL_TABLET | ORAL | Status: DC | PRN

## 2014-04-08 MED ORDER — FLUTICASONE PROPIONATE 50 MCG/ACT NA SUSP: NASAL | Status: DC

## 2014-04-08 MED ORDER — AMOXICILLIN 500 MG PO CAPS: 500.0000 mg | ORAL_CAPSULE | Freq: Three times a day (TID) | ORAL | Status: DC

## 2014-04-08 NOTE — Discharge Instructions (Signed)
Sinusitis °Sinusitis is redness, soreness, and puffiness (inflammation) of the air pockets in the bones of your face (sinuses). The redness, soreness, and puffiness can cause air and mucus to get trapped in your sinuses. This can allow germs to grow and cause an infection.  °HOME CARE  °· Drink enough fluids to keep your pee (urine) clear or pale yellow. °· Use a humidifier in your home. °· Run a hot shower to create steam in the bathroom. Sit in the bathroom with the door closed. Breathe in the steam 3-4 times a day. °· Put a warm, moist washcloth on your face 3-4 times a day, or as told by your doctor. °· Use salt water sprays (saline sprays) to wet the thick fluid in your nose. This can help the sinuses drain. °· Only take medicine as told by your doctor. °GET HELP RIGHT AWAY IF:  °· Your pain gets worse. °· You have very bad headaches. °· You are sick to your stomach (nauseous). °· You throw up (vomit). °· You are very sleepy (drowsy) all the time. °· Your face is puffy (swollen). °· Your vision changes. °· You have a stiff neck. °· You have trouble breathing. °MAKE SURE YOU:  °· Understand these instructions. °· Will watch your condition. °· Will get help right away if you are not doing well or get worse. °Document Released: 09/27/2007 Document Revised: 01/03/2012 Document Reviewed: 11/14/2011 °ExitCare® Patient Information ©2015 [employer]. This information is not intended to replace advice given to you by your health care provider. Make sure you discuss any questions you have with your health care provider. ° °

## 2014-04-08 NOTE — ED Notes (Signed)
MD at bedside. 

## 2014-04-08 NOTE — ED Notes (Signed)
Pt reports he has left sided pain in his forehead that travels to his left eye, left nose, left ear, and down to the left side of his neck. Pt states he is either hot or cold. Pt reports he has a productive cough with yellow sputum and nasal drainage that is clear.Pt also reports his throat is sore. Pt describes his pain as pressure. A&O X4.

## 2014-04-08 NOTE — ED Provider Notes (Signed)
CSN: [id number]     Arrival date & time 04/08/14  0405 History   First MD Initiated Contact with Patient 04/08/14 [phone]     Chief Complaint  Patient presents with  . Sinusitis      HPI  Patient position evaluation of head pain. Has pain that is retro-orbital. Feels congested in his left nares. States he feels like S lay on his right side to breathe out of the left side of his nose. Has a morning cough productive of clear sputum. Feels pressure in his left ear. No fevers. No shortness of breath no additional symptoms.  Past Medical History  Diagnosis Date  . Chronic back pain greater than 3 months duration    Past Surgical History  Procedure Laterality Date  . Hemorrhoid surgery    . Tonsillectomy     No family history on file. History  Substance Use Topics  . Smoking status: Current Every Day Smoker -- 0.50 packs/day    Types: Cigarettes  . Smokeless tobacco: Not on file  . Alcohol Use: Yes     Comment: occasionally    Review of Systems  Constitutional: Negative for fever, chills, diaphoresis, appetite change and fatigue.  HENT: Positive for congestion, postnasal drip, rhinorrhea, sinus pressure and sore throat. Negative for mouth sores and trouble swallowing.   Eyes: Negative for visual disturbance.  Respiratory: Negative for cough, chest tightness, shortness of breath and wheezing.   Cardiovascular: Negative for chest pain.  Gastrointestinal: Negative for nausea, vomiting, abdominal pain, diarrhea and abdominal distention.  Endocrine: Negative for polydipsia, polyphagia and polyuria.  Genitourinary: Negative for dysuria, frequency and hematuria.  Musculoskeletal: Negative for gait problem.  Skin: Negative for color change, pallor and rash.  Neurological: Positive for headaches. Negative for dizziness, syncope and light-headedness.  Hematological: Does not bruise/bleed easily.  Psychiatric/Behavioral: Negative for behavioral problems and confusion.      Allergies   Review of patient's allergies indicates no known allergies.  Home Medications   Prior to Admission medications   Medication Sig Start Date End Date Taking? Authorizing Provider  ciprofloxacin (CIPRO) 500 MG tablet Take 500 mg by mouth every 12 (twelve) hours.   Yes Historical Provider, MD  ibuprofen (ADVIL,MOTRIN) 200 MG tablet Take 600 mg by mouth every 6 (six) hours as needed for moderate pain.   Yes Historical Provider, MD  oxyCODONE-acetaminophen (PERCOCET/ROXICET) 5-325 MG per tablet Take 2 tablets by mouth every 6 (six) hours as needed for moderate pain or severe pain. 11/01/13  Yes Junius FinnerErin O'Malley, PA-C  tamsulosin (FLOMAX) 0.4 MG CAPS capsule Take 0.4 mg by mouth daily.   Yes Historical Provider, MD  amoxicillin (AMOXIL) 500 MG capsule Take 1 capsule (500 mg total) by mouth 3 (three) times daily. 04/08/14   Rolland PorterMark Soraiya Ahner, MD  diazepam (VALIUM) 5 MG tablet Take 1 tablet (5 mg total) by mouth 2 (two) times daily. Patient not taking: Reported on 04/08/2014 11/01/13   Junius FinnerErin O'Malley, PA-C  erythromycin ophthalmic ointment Place a 1/2 inch ribbon of ointment into the lower eyelids bilaterally four times per day for 7 days. Patient not taking: Reported on 04/08/2014 02/27/14   Marissa Sciacca, PA-C  fluticasone Aleda Grana(FLONASE) 50 MCG/ACT nasal spray 1 spray each nares bid 04/08/14   Rolland PorterMark Alora Gorey, MD  HYDROcodone-acetaminophen (NORCO/VICODIN) 5-325 MG per tablet Take 2 tablets by mouth every 4 (four) hours as needed. 04/08/14   Rolland PorterMark Deagen Krass, MD  predniSONE (DELTASONE) 20 MG tablet Take 2 tablets (40 mg total) by mouth daily. Patient not taking:  Reported on 04/08/2014 11/01/13   Junius FinnerErin O'Malley, PA-C   BP 123/83 mmHg  Pulse 72  Temp(Src) 97.6 F (36.4 C) (Oral)  Resp 16  Ht 6\' 3"  (1.905 m)  Wt 262 lb (118.842 kg)  BMI 32.75 kg/m2  SpO2 96% Physical Exam  Constitutional: He is oriented to person, place, and time. He appears well-developed and well-nourished. No distress.  HENT:  Head: Normocephalic.   Feels pressure over the left maxilla. Normal eye exam. No proptosis or enophthalmos. Congestion in the nares. Posterior pharynx benign. Left middle ear effusion. Tender left anterior cervical adenopathy.  Eyes: Conjunctivae are normal. Pupils are equal, round, and reactive to light. No scleral icterus.  Neck: Normal range of motion. Neck supple. No thyromegaly present.  Cardiovascular: Normal rate and regular rhythm.  Exam reveals no gallop and no friction rub.   No murmur heard. Pulmonary/Chest: Effort normal and breath sounds normal. No respiratory distress. He has no wheezes. He has no rales.  Abdominal: Soft. Bowel sounds are normal. He exhibits no distension. There is no tenderness. There is no rebound.  Musculoskeletal: Normal range of motion.  Neurological: He is alert and oriented to person, place, and time.  Skin: Skin is warm and dry. No rash noted.  Psychiatric: He has a normal mood and affect. His behavior is normal.    ED Course  Procedures (including critical care time) Labs Review Labs Reviewed - No data to display  Imaging Review No results found.   EKG Interpretation None      MDM   Final diagnoses:  Acute maxillary sinusitis, recurrence not specified    Plan amoxicillin, Flonase, Vicodin, primary care follow-up.    Rolland PorterMark Daschel Roughton, MD 04/08/14 [id number]

## 2014-06-15 ENCOUNTER — Emergency Department (HOSPITAL_COMMUNITY)
Admission: EM | Admit: 2014-06-15 | Discharge: 2014-06-15 | Disposition: A | Discharge status: Home / Self Care | Payer: Medicare Other | Attending: Emergency Medicine | Admitting: Emergency Medicine

## 2014-06-15 ENCOUNTER — Emergency Department (HOSPITAL_COMMUNITY): Payer: Medicare Other

## 2014-06-15 ENCOUNTER — Encounter (HOSPITAL_COMMUNITY): Payer: Self-pay | Admitting: *Deleted

## 2014-06-15 DIAGNOSIS — Z720 Tobacco use: Secondary | ICD-10-CM | POA: Insufficient documentation

## 2014-06-15 DIAGNOSIS — M4802 Spinal stenosis, cervical region: Secondary | ICD-10-CM

## 2014-06-15 DIAGNOSIS — G8929 Other chronic pain: Secondary | ICD-10-CM | POA: Insufficient documentation

## 2014-06-15 MED ORDER — IBUPROFEN 400 MG PO TABS
600.0000 mg | ORAL_TABLET | Freq: Once | ORAL | Status: AC
  Administered 2014-06-15: 600 mg via ORAL
  Filled 2014-06-15 (×2): qty 1

## 2014-06-15 MED ORDER — IBUPROFEN 800 MG PO TABS: 800.0000 mg | ORAL_TABLET | Freq: Three times a day (TID) | ORAL | Status: DC | PRN

## 2014-06-15 MED ORDER — OXYCODONE-ACETAMINOPHEN 5-325 MG PO TABS: 2.0000 | ORAL_TABLET | Freq: Four times a day (QID) | ORAL | Status: DC | PRN

## 2014-06-15 NOTE — Discharge Instructions (Signed)
Spinal Stenosis Spinal stenosis is an abnormal narrowing of the canals of your spine (vertebrae). CAUSES  Spinal stenosis is caused by areas of bone pushing into the central canals of your vertebrae. This condition can be present at birth (congenital). It also may be caused by arthritic deterioration of your vertebrae (spinal degeneration).  SYMPTOMS   Pain that is generally worse with activities, particularly standing and walking.  Numbness, tingling, hot or cold sensations, weakness, or weariness in your legs.  Frequent episodes of falling.  A foot-slapping gait that leads to muscle weakness. DIAGNOSIS  Spinal stenosis is diagnosed with the use of magnetic resonance imaging (MRI) or computed tomography (CT). TREATMENT  Initial therapy for spinal stenosis focuses on the management of the pain and other symptoms associated with the condition. These therapies include:  Practicing postural changes to lessen pressure on your nerves.  Exercises to strengthen the core of your body.  Loss of excess body weight.  The use of nonsteroidal anti-inflammatory medicines to reduce swelling and inflammation in your nerves. When therapies to manage pain are not successful, surgery to treat spinal stenosis may be recommended. This surgery involves removing excess bone, which puts pressure on your nerve roots. During this surgery (laminectomy), the posterior boney arch (lamina) and excess bone around the facet joints are removed. Document Released: 07/01/2003 Document Revised: 08/25/2013 Document Reviewed: 07/19/2012 ExitCare Patient Information 2015 [employer]. This information is not intended to replace advice given to you by your health care provider. Make sure you discuss any questions you have with your health care provider.  

## 2014-06-15 NOTE — ED Provider Notes (Signed)
CSN: [id number]     Arrival date & time 06/15/14  1506 History   First MD Initiated Contact with Patient 06/15/14 1744     Chief Complaint  Patient presents with  . Arm Pain  . Leg Pain  . Shoulder Pain      HPI  Expand All Collapse All   Pt c/o left arm pain, left leg and right shoulder pain. Pt denies any recent injury, fall, car accident. Pt does not work, denies any recent heavy lifting. Pt first noticed his left arm pain after he woke up four days ago. Pt states right shoulder pain for two weeks.         Past Medical History  Diagnosis Date  . Chronic back pain greater than 3 months duration    Past Surgical History  Procedure Laterality Date  . Hemorrhoid surgery    . Tonsillectomy     History reviewed. No pertinent family history. History  Substance Use Topics  . Smoking status: Current Every Day Smoker -- 0.50 packs/day    Types: Cigarettes  . Smokeless tobacco: Not on file  . Alcohol Use: Yes     Comment: occasionally    Review of Systems  All other systems reviewed and are negative.     Allergies  Review of patient's allergies indicates no known allergies.  Home Medications   Prior to Admission medications   Medication Sig Start Date End Date Taking? Authorizing Provider  amoxicillin (AMOXIL) 500 MG capsule Take 1 capsule (500 mg total) by mouth 3 (three) times daily. Patient not taking: Reported on 06/15/2014 04/08/14   Rolland PorterMark James, MD  diazepam (VALIUM) 5 MG tablet Take 1 tablet (5 mg total) by mouth 2 (two) times daily. Patient not taking: Reported on 04/08/2014 11/01/13   Junius FinnerErin O'Malley, PA-C  erythromycin ophthalmic ointment Place a 1/2 inch ribbon of ointment into the lower eyelids bilaterally four times per day for 7 days. Patient not taking: Reported on 04/08/2014 02/27/14   Marissa Sciacca, PA-C  fluticasone Aleda Grana(FLONASE) 50 MCG/ACT nasal spray 1 spray each nares bid Patient not taking: Reported on 06/15/2014 04/08/14   Rolland PorterMark James, MD   HYDROcodone-acetaminophen (NORCO/VICODIN) 5-325 MG per tablet Take 2 tablets by mouth every 4 (four) hours as needed. Patient not taking: Reported on 06/15/2014 04/08/14   Rolland PorterMark James, MD  ibuprofen (ADVIL,MOTRIN) 800 MG tablet Take 1 tablet (800 mg total) by mouth every 8 (eight) hours as needed. 06/15/14   Nelia Shiobert L Tamotsu Wiederholt, MD  oxyCODONE-acetaminophen (PERCOCET/ROXICET) 5-325 MG per tablet Take 2 tablets by mouth every 6 (six) hours as needed for moderate pain or severe pain. 06/15/14   Nelia Shiobert L Rahmel Nedved, MD   BP 144/81 mmHg  Pulse 65  Temp(Src) 98.2 F (36.8 C) (Oral)  Resp 16  Ht 6\' 3"  (1.905 m)  Wt 263 lb (119.296 kg)  BMI 32.87 kg/m2  SpO2 97% Physical Exam  Constitutional: He is oriented to person, place, and time. He appears well-developed and well-nourished. No distress.  HENT:  Head: Normocephalic and atraumatic.  Eyes: Pupils are equal, round, and reactive to light.  Neck: Normal range of motion.  Cardiovascular: Normal rate and intact distal pulses.   Pulmonary/Chest: No respiratory distress.  Abdominal: Normal appearance. He exhibits no distension.  Musculoskeletal: Normal range of motion.  Neurological: He is alert and oriented to person, place, and time. No cranial nerve deficit or sensory deficit. GCS eye subscore is 4. GCS verbal subscore is 5. GCS motor subscore is 6.  Skin:  Skin is warm and dry. No rash noted.  Psychiatric: He has a normal mood and affect. His behavior is normal.  Nursing note and vitals reviewed.   ED Course  Procedures (including critical care time) Labs Review Labs Reviewed - No data to display  Imaging Review Mr Cervical Spine Wo Contrast  06/15/2014   CLINICAL DATA:  Woke up with LEFT arm pain 4 days ago, RIGHT shoulder pain for 2 weeks. LEFT calf pain. No injury.  EXAM: MRI CERVICAL SPINE WITHOUT CONTRAST  TECHNIQUE: Multiplanar, multisequence MR imaging of the cervical spine was performed. No intravenous contrast was administered.   COMPARISON:  None.  FINDINGS: Mild to moderately motion degraded examination. The vertebral bodies are intact filling, straightened cervical lordosis. Mild to moderate C5-6 and mild C6-7 disc height loss. Decreased T2 signal within all cervical discs consistent with mild desiccation. Mild acute on chronic discogenic endplate change at C6-7, mild chronic discogenic endplate changes C4-5 and C5-6. No STIR signal abnormality to suggest fracture.  Moderate canal narrowing on the basis of foreshortened pedicles. Spinal cord deformity as described below, no cord edema or syrinx. Craniocervical junction is intact. Prominent appearing submental lymph nodes, incompletely imaged. Paraspinal soft tissues are nonsuspicious.  Level by level evaluation (Mild motion degraded examination limits evaluation.  C2-3: 1-2 mm annular bulging, mild facet arthropathy without canal stenosis. Mild neural foraminal narrowing.  C3-4: Small broad-based disc bulge, uncovertebral hypertrophy and mild facet arthropathy. Mild to moderate canal stenosis. Moderate to severe LEFT greater than RIGHT neural foraminal narrowing.  C4-5: Small broad-based disc bulge, uncovertebral hypertrophy and mild facet arthropathy. Mild to moderate canal stenosis. Moderate to severe LEFT greater than RIGHT neural foraminal narrowing.  C5-6: Broad-based central disc protrusion superimposed on broad-based disc bulge results in moderate to severe canal stenosis, AP dimension the canal is 7 mm. Ventral and dorsal cord deformity. Severe bilateral neural foraminal narrowing.  C6-7: Moderate broad-based disc bulge, uncovertebral hypertrophy and at least mild facet arthropathy result in moderate to severe canal stenosis, AP dimension the canal is 7 mm ; ventral and dorsal cord deformity. Severe LEFT greater than RIGHT neural foraminal narrowing suspected.  C7-T1: No disc bulge. Mild to moderate facet arthropathy. No canal stenosis there are mild neural foraminal narrowing.   IMPRESSION: Moderately motion degraded examination.  Degenerative change of the cervical spine superimposed on a background of congenital canal narrowing without acute fracture nor malalignment.  Moderate to severe canal stenosis at C5-6 and C6-7, cord deformity without cord edema. Mild to moderate canal stenosis C3-4 and C4-5.  Neural foraminal narrowing C3-4 thru C7-T1: Severe at C5-6 and C6-7 though motion degrades evaluation. Moderate to severe neural foraminal narrowing C3-4 and C4-5.   Electronically Signed   By: Awilda Metro   On: 06/15/2014 21:48    Will refer to neurosurgurey  MDM   Final diagnoses:  Spinal stenosis of cervical region        Nelia Shi, MD 06/15/14 2229

## 2014-06-15 NOTE — ED Notes (Signed)
PT requests a time frame for MRI. MRI states it should be 1 hr-1hr . PT made aware.

## 2014-06-15 NOTE — ED Notes (Signed)
Dr Beaton at bedside 

## 2014-06-15 NOTE — ED Notes (Signed)
Pt transported to MRI 

## 2014-06-15 NOTE — ED Notes (Addendum)
Pt c/o left arm pain, left calf pain, and right shoulder pain. Pt denies any recent injury, fall, car accident. Pt does not work, denies any recent heavy lifting. Pt first noticed his left arm pain after he woke up four days ago. Pt states right shoulder pain for two weeks. Pt states left arm pain increases with movement and certain positions.

## 2014-12-16 ENCOUNTER — Other Ambulatory Visit: Payer: Self-pay | Admitting: Neurosurgery

## 2014-12-16 DIAGNOSIS — M4316 Spondylolisthesis, lumbar region: Secondary | ICD-10-CM

## 2014-12-18 ENCOUNTER — Ambulatory Visit
Admission: RE | Admit: 2014-12-18 | Discharge: 2014-12-18 | Disposition: A | Discharge status: Home / Self Care | Payer: Medicare Other | Source: Ambulatory Visit | Attending: Neurosurgery | Admitting: Neurosurgery

## 2014-12-18 DIAGNOSIS — M4316 Spondylolisthesis, lumbar region: Secondary | ICD-10-CM

## 2014-12-29 ENCOUNTER — Other Ambulatory Visit: Payer: Self-pay | Admitting: Neurosurgery

## 2015-01-18 NOTE — Pre-Procedure Instructions (Signed)
    Craig Coleman  01/18/2015      [facility] - [place], [place] - [address] [place] [address] Phone: [phone] Fax: [phone]    Your procedure is scheduled on Wednesday, January 27, 2015  Report to [facility] Admitting at 6:30 A.M.  Call this number if you have problems the morning of surgery:  [phone]   Remember:  Do not eat food or drink liquids after midnight.   Take these medicines the morning of surgery with A SIP OF WATER: Ciprofloxacin  7 days prior to surgery, stop taking the following: NSAIDS, Aspirin, Aleve, Naproxen, Ibuprofen, Advil, Motrin, BC's, Goody's, fish oil, all herbal medications, and all vitamins.    Do not wear jewelry.  Do not wear lotions, powders, or colognes.  You may NOT wear deodorant.  Men may shave face and neck.  Do not bring valuables to the hospital.  [facility] is not responsible for any belongings or valuables.  Contacts, dentures or bridgework may not be worn into surgery.  Leave your suitcase in the car.  After surgery it may be brought to your room.  For patients admitted to the hospital, discharge time will be determined by your treatment team.  Patients discharged the day of surgery will not be allowed to drive home.   Special instructions:  See attached.   Please read over the following fact sheets that you were given. Pain Booklet, Coughing and Deep Breathing, Blood Transfusion Information, MRSA Information and Surgical Site Infection Prevention

## 2015-01-19 ENCOUNTER — Ambulatory Visit (HOSPITAL_COMMUNITY)
Admission: RE | Admit: 2015-01-19 | Discharge: 2015-01-19 | Disposition: A | Discharge status: Home / Self Care | Payer: Medicare Other | Source: Ambulatory Visit | Attending: Anesthesiology | Admitting: Anesthesiology

## 2015-01-19 ENCOUNTER — Encounter (HOSPITAL_COMMUNITY): Payer: Self-pay

## 2015-01-19 ENCOUNTER — Encounter (HOSPITAL_COMMUNITY)
Admission: RE | Admit: 2015-01-19 | Discharge: 2015-01-19 | Disposition: A | Discharge status: Home / Self Care | Payer: Medicare Other | Source: Ambulatory Visit | Attending: Neurosurgery | Admitting: Neurosurgery

## 2015-01-19 DIAGNOSIS — Z01812 Encounter for preprocedural laboratory examination: Secondary | ICD-10-CM | POA: Insufficient documentation

## 2015-01-19 DIAGNOSIS — Z0183 Encounter for blood typing: Secondary | ICD-10-CM | POA: Insufficient documentation

## 2015-01-19 DIAGNOSIS — R059 Cough, unspecified: Secondary | ICD-10-CM

## 2015-01-19 DIAGNOSIS — Z01818 Encounter for other preprocedural examination: Secondary | ICD-10-CM

## 2015-01-19 DIAGNOSIS — M4310 Spondylolisthesis, site unspecified: Secondary | ICD-10-CM | POA: Insufficient documentation

## 2015-01-19 DIAGNOSIS — R509 Fever, unspecified: Secondary | ICD-10-CM

## 2015-01-19 DIAGNOSIS — Z87891 Personal history of nicotine dependence: Secondary | ICD-10-CM | POA: Diagnosis not present

## 2015-01-19 DIAGNOSIS — R05 Cough: Secondary | ICD-10-CM

## 2015-01-19 HISTORY — DX: Insomnia, unspecified: G47.00

## 2015-01-19 HISTORY — DX: Pain in unspecified joint: M25.50

## 2015-01-19 HISTORY — DX: Adverse effect of unspecified anesthetic, initial encounter: T41.45XA

## 2015-01-19 HISTORY — DX: Gastro-esophageal reflux disease without esophagitis: K21.9

## 2015-01-19 HISTORY — DX: Nocturia: R35.1

## 2015-01-19 HISTORY — DX: Other complications of anesthesia, initial encounter: T88.59XA

## 2015-01-19 HISTORY — DX: Unspecified osteoarthritis, unspecified site: M19.90

## 2015-01-19 HISTORY — DX: Benign prostatic hyperplasia without lower urinary tract symptoms: N40.0

## 2015-01-19 LAB — CBC
HCT: 45.9 % (ref 39.0–52.0)
HEMOGLOBIN: 15.2 g/dL (ref 13.0–17.0)
MCH: 27.5 pg (ref 26.0–34.0)
MCHC: 33.1 g/dL (ref 30.0–36.0)
MCV: 83.2 fL (ref 78.0–100.0)
PLATELETS: 214 10*3/uL (ref 150–400)
RBC: 5.52 MIL/uL (ref 4.22–5.81)
RDW: 14.8 % (ref 11.5–15.5)
WBC: 11 10*3/uL — AB (ref 4.0–10.5)

## 2015-01-19 LAB — BASIC METABOLIC PANEL
ANION GAP: 8 (ref 5–15)
BUN: 9 mg/dL (ref 6–20)
CALCIUM: 9.4 mg/dL (ref 8.9–10.3)
CO2: 25 mmol/L (ref 22–32)
CREATININE: 1.07 mg/dL (ref 0.61–1.24)
Chloride: 108 mmol/L (ref 101–111)
Glucose, Bld: 90 mg/dL (ref 65–99)
Potassium: 3.8 mmol/L (ref 3.5–5.1)
SODIUM: 141 mmol/L (ref 135–145)

## 2015-01-19 LAB — TYPE AND SCREEN
ABO/RH(D): O POS
ANTIBODY SCREEN: NEGATIVE

## 2015-01-19 LAB — ABO/RH: ABO/RH(D): O POS

## 2015-01-19 LAB — SURGICAL PCR SCREEN
MRSA, PCR: NEGATIVE
Staphylococcus aureus: NEGATIVE

## 2015-01-19 NOTE — Progress Notes (Signed)
PCP - Dr. Fleet Contras Cardiologist - denies  EKG - 01/12/15 - requested CXR - 01/19/15 - Epic  Echo/stress test/Cardia cath - pt denies  Pt. States that he has had a recent cough and unsure about a fever.  CXR has been ordered.  Pt. Denies chest pain at PAT appointment.  Patient states that he was taking Cipro 500 mg for a prostate infection but it caused him to have diarrhea and so he stopped taking it.   EKG, recent labs, and last office visit have been requested from Dr. Concepcion Elk.

## 2015-01-26 MED ORDER — CEFAZOLIN SODIUM-DEXTROSE 2-3 GM-% IV SOLR
2.0000 g | INTRAVENOUS | Status: AC
  Administered 2015-01-27: 2 g via INTRAVENOUS
  Filled 2015-01-26: qty 50

## 2015-01-26 MED ORDER — DEXAMETHASONE SODIUM PHOSPHATE 10 MG/ML IJ SOLN
10.0000 mg | INTRAMUSCULAR | Status: AC
  Administered 2015-01-27: 10 mg via INTRAVENOUS
  Filled 2015-01-26: qty 1

## 2015-01-27 ENCOUNTER — Encounter (HOSPITAL_COMMUNITY)
Admission: RE | Disposition: A | Discharge status: Home / Self Care | Payer: Self-pay | Source: Ambulatory Visit | Attending: Neurosurgery

## 2015-01-27 ENCOUNTER — Inpatient Hospital Stay (HOSPITAL_COMMUNITY): Payer: Medicare Other | Admitting: Anesthesiology

## 2015-01-27 ENCOUNTER — Encounter (HOSPITAL_COMMUNITY): Payer: Self-pay | Admitting: *Deleted

## 2015-01-27 ENCOUNTER — Inpatient Hospital Stay (HOSPITAL_COMMUNITY): Payer: Medicare Other

## 2015-01-27 ENCOUNTER — Inpatient Hospital Stay (HOSPITAL_COMMUNITY)
Admission: RE | Admit: 2015-01-27 | Discharge: 2015-01-28 | DRG: 460 | Disposition: A | Discharge status: Home / Self Care | Payer: Medicare Other | Source: Ambulatory Visit | Attending: Neurosurgery | Admitting: Neurosurgery

## 2015-01-27 DIAGNOSIS — M4316 Spondylolisthesis, lumbar region: Principal | ICD-10-CM | POA: Diagnosis present

## 2015-01-27 DIAGNOSIS — G8918 Other acute postprocedural pain: Secondary | ICD-10-CM | POA: Diagnosis not present

## 2015-01-27 DIAGNOSIS — M1990 Unspecified osteoarthritis, unspecified site: Secondary | ICD-10-CM | POA: Diagnosis present

## 2015-01-27 DIAGNOSIS — Z86018 Personal history of other benign neoplasm: Secondary | ICD-10-CM | POA: Diagnosis not present

## 2015-01-27 DIAGNOSIS — M545 Low back pain: Secondary | ICD-10-CM | POA: Diagnosis present

## 2015-01-27 DIAGNOSIS — Z419 Encounter for procedure for purposes other than remedying health state, unspecified: Secondary | ICD-10-CM

## 2015-01-27 DIAGNOSIS — Z792 Long term (current) use of antibiotics: Secondary | ICD-10-CM | POA: Diagnosis not present

## 2015-01-27 DIAGNOSIS — M4806 Spinal stenosis, lumbar region: Secondary | ICD-10-CM | POA: Diagnosis present

## 2015-01-27 DIAGNOSIS — F17210 Nicotine dependence, cigarettes, uncomplicated: Secondary | ICD-10-CM | POA: Diagnosis present

## 2015-01-27 DIAGNOSIS — Z8719 Personal history of other diseases of the digestive system: Secondary | ICD-10-CM | POA: Diagnosis not present

## 2015-01-27 DIAGNOSIS — Z9889 Other specified postprocedural states: Secondary | ICD-10-CM | POA: Diagnosis not present

## 2015-01-27 DIAGNOSIS — K219 Gastro-esophageal reflux disease without esophagitis: Secondary | ICD-10-CM | POA: Diagnosis present

## 2015-01-27 DIAGNOSIS — M549 Dorsalgia, unspecified: Secondary | ICD-10-CM | POA: Diagnosis present

## 2015-01-27 DIAGNOSIS — Z720 Tobacco use: Secondary | ICD-10-CM | POA: Diagnosis not present

## 2015-01-27 DIAGNOSIS — Z87448 Personal history of other diseases of urinary system: Secondary | ICD-10-CM | POA: Diagnosis not present

## 2015-01-27 DIAGNOSIS — G8929 Other chronic pain: Secondary | ICD-10-CM | POA: Diagnosis not present

## 2015-01-27 SURGERY — POSTERIOR LUMBAR FUSION 1 LEVEL
Anesthesia: General | Site: Back

## 2015-01-27 MED ORDER — PHENYLEPHRINE 40 MCG/ML (10ML) SYRINGE FOR IV PUSH (FOR BLOOD PRESSURE SUPPORT)
PREFILLED_SYRINGE | INTRAVENOUS | Status: AC
  Filled 2015-01-27: qty 10

## 2015-01-27 MED ORDER — FENTANYL CITRATE (PF) 250 MCG/5ML IJ SOLN
INTRAMUSCULAR | Status: AC
  Filled 2015-01-27: qty 5

## 2015-01-27 MED ORDER — ROCURONIUM BROMIDE 100 MG/10ML IV SOLN
INTRAVENOUS | Status: DC | PRN
  Administered 2015-01-27: 10 mg via INTRAVENOUS
  Administered 2015-01-27: 50 mg via INTRAVENOUS
  Administered 2015-01-27 (×2): 10 mg via INTRAVENOUS

## 2015-01-27 MED ORDER — HYDROMORPHONE HCL 1 MG/ML IJ SOLN
INTRAMUSCULAR | Status: AC
  Filled 2015-01-27: qty 1

## 2015-01-27 MED ORDER — SODIUM CHLORIDE 0.9 % IV SOLN: 250.0000 mL | INTRAVENOUS | Status: DC

## 2015-01-27 MED ORDER — LACTATED RINGERS IV SOLN
INTRAVENOUS | Status: DC | PRN
  Administered 2015-01-27 (×3): via INTRAVENOUS

## 2015-01-27 MED ORDER — LIDOCAINE HCL (CARDIAC) 20 MG/ML IV SOLN
INTRAVENOUS | Status: AC
  Filled 2015-01-27: qty 10

## 2015-01-27 MED ORDER — EPHEDRINE SULFATE 50 MG/ML IJ SOLN
INTRAMUSCULAR | Status: DC | PRN
  Administered 2015-01-27: 5 mg via INTRAVENOUS

## 2015-01-27 MED ORDER — ACETAMINOPHEN 650 MG RE SUPP: 650.0000 mg | RECTAL | Status: DC | PRN

## 2015-01-27 MED ORDER — HYDROMORPHONE HCL 1 MG/ML IJ SOLN
0.5000 mg | INTRAMUSCULAR | Status: DC | PRN
  Administered 2015-01-27 (×2): 1 mg via INTRAVENOUS
  Filled 2015-01-27 (×2): qty 1

## 2015-01-27 MED ORDER — VANCOMYCIN HCL 1000 MG IV SOLR
INTRAVENOUS | Status: DC | PRN
  Administered 2015-01-27: 1000 mg via TOPICAL

## 2015-01-27 MED ORDER — PHENOL 1.4 % MT LIQD: 1.0000 | OROMUCOSAL | Status: DC | PRN

## 2015-01-27 MED ORDER — 0.9 % SODIUM CHLORIDE (POUR BTL) OPTIME
TOPICAL | Status: DC | PRN
  Administered 2015-01-27: 1000 mL

## 2015-01-27 MED ORDER — MENTHOL 3 MG MT LOZG: 1.0000 | LOZENGE | OROMUCOSAL | Status: DC | PRN

## 2015-01-27 MED ORDER — ROCURONIUM BROMIDE 50 MG/5ML IV SOLN
INTRAVENOUS | Status: AC
  Filled 2015-01-27: qty 2

## 2015-01-27 MED ORDER — ARTIFICIAL TEARS OP OINT
TOPICAL_OINTMENT | OPHTHALMIC | Status: AC
  Filled 2015-01-27: qty 3.5

## 2015-01-27 MED ORDER — BUPIVACAINE LIPOSOME 1.3 % IJ SUSP
INTRAMUSCULAR | Status: DC | PRN
  Administered 2015-01-27: 20 mL

## 2015-01-27 MED ORDER — SUGAMMADEX SODIUM 500 MG/5ML IV SOLN
INTRAVENOUS | Status: DC | PRN
  Administered 2015-01-27: 400 mg via INTRAVENOUS

## 2015-01-27 MED ORDER — MIDAZOLAM HCL 2 MG/2ML IJ SOLN
INTRAMUSCULAR | Status: AC
  Filled 2015-01-27: qty 4

## 2015-01-27 MED ORDER — EPHEDRINE SULFATE 50 MG/ML IJ SOLN
INTRAMUSCULAR | Status: AC
  Filled 2015-01-27: qty 1

## 2015-01-27 MED ORDER — CIPROFLOXACIN HCL 500 MG PO TABS: 500.0000 mg | ORAL_TABLET | Freq: Two times a day (BID) | ORAL | Status: DC

## 2015-01-27 MED ORDER — ONDANSETRON HCL 4 MG/2ML IJ SOLN
INTRAMUSCULAR | Status: AC
  Filled 2015-01-27: qty 2

## 2015-01-27 MED ORDER — PROMETHAZINE HCL 25 MG/ML IJ SOLN: 6.2500 mg | INTRAMUSCULAR | Status: DC | PRN

## 2015-01-27 MED ORDER — OXYCODONE HCL 5 MG/5ML PO SOLN: 5.0000 mg | Freq: Once | ORAL | Status: AC | PRN

## 2015-01-27 MED ORDER — OXYCODONE HCL 5 MG PO TABS
ORAL_TABLET | ORAL | Status: AC
  Filled 2015-01-27: qty 1

## 2015-01-27 MED ORDER — SODIUM CHLORIDE 0.9 % IJ SOLN: 3.0000 mL | Freq: Two times a day (BID) | INTRAMUSCULAR | Status: DC

## 2015-01-27 MED ORDER — GLYCOPYRROLATE 0.2 MG/ML IJ SOLN
INTRAMUSCULAR | Status: AC
  Filled 2015-01-27: qty 2

## 2015-01-27 MED ORDER — NEOSTIGMINE METHYLSULFATE 10 MG/10ML IV SOLN
INTRAVENOUS | Status: AC
  Filled 2015-01-27: qty 1

## 2015-01-27 MED ORDER — CYCLOBENZAPRINE HCL 10 MG PO TABS
10.0000 mg | ORAL_TABLET | Freq: Three times a day (TID) | ORAL | Status: DC | PRN
Start: 2015-01-27 — End: 2015-01-28
  Administered 2015-01-27 – 2015-01-28 (×3): 10 mg via ORAL
  Filled 2015-01-27 (×3): qty 1

## 2015-01-27 MED ORDER — ALUM & MAG HYDROXIDE-SIMETH 200-200-20 MG/5ML PO SUSP: 30.0000 mL | Freq: Four times a day (QID) | ORAL | Status: DC | PRN

## 2015-01-27 MED ORDER — CYCLOBENZAPRINE HCL 10 MG PO TABS
ORAL_TABLET | ORAL | Status: AC
  Filled 2015-01-27: qty 1

## 2015-01-27 MED ORDER — VANCOMYCIN HCL 1000 MG IV SOLR
INTRAVENOUS | Status: AC
Start: 2015-01-27 — End: 2015-01-27
  Filled 2015-01-27: qty 1000

## 2015-01-27 MED ORDER — OXYCODONE-ACETAMINOPHEN 5-325 MG PO TABS
1.0000 | ORAL_TABLET | ORAL | Status: DC | PRN
  Administered 2015-01-27 – 2015-01-28 (×5): 2 via ORAL
  Filled 2015-01-27 (×5): qty 2

## 2015-01-27 MED ORDER — SODIUM CHLORIDE 0.9 % IJ SOLN: 3.0000 mL | INTRAMUSCULAR | Status: DC | PRN

## 2015-01-27 MED ORDER — FENTANYL CITRATE (PF) 100 MCG/2ML IJ SOLN
INTRAMUSCULAR | Status: DC | PRN
  Administered 2015-01-27 (×2): 50 ug via INTRAVENOUS
  Administered 2015-01-27: 100 ug via INTRAVENOUS
  Administered 2015-01-27: 150 ug via INTRAVENOUS

## 2015-01-27 MED ORDER — ONDANSETRON HCL 4 MG/2ML IJ SOLN: 4.0000 mg | INTRAMUSCULAR | Status: DC | PRN

## 2015-01-27 MED ORDER — HYDROMORPHONE HCL 1 MG/ML IJ SOLN
0.2500 mg | INTRAMUSCULAR | Status: DC | PRN
  Administered 2015-01-27 (×4): 0.5 mg via INTRAVENOUS

## 2015-01-27 MED ORDER — OXYCODONE HCL 5 MG PO TABS
5.0000 mg | ORAL_TABLET | Freq: Once | ORAL | Status: AC | PRN
Start: 2015-01-27 — End: 2015-01-27
  Administered 2015-01-27: 5 mg via ORAL

## 2015-01-27 MED ORDER — PROPOFOL 10 MG/ML IV BOLUS
INTRAVENOUS | Status: AC
  Filled 2015-01-27: qty 20

## 2015-01-27 MED ORDER — SURGIFOAM 100 EX MISC
CUTANEOUS | Status: DC | PRN
  Administered 2015-01-27: 10:00:00 via TOPICAL

## 2015-01-27 MED ORDER — MIDAZOLAM HCL 5 MG/5ML IJ SOLN
INTRAMUSCULAR | Status: DC | PRN
  Administered 2015-01-27: 2 mg via INTRAVENOUS

## 2015-01-27 MED ORDER — LIDOCAINE HCL (CARDIAC) 20 MG/ML IV SOLN
INTRAVENOUS | Status: DC | PRN
  Administered 2015-01-27: 100 mg via INTRAVENOUS

## 2015-01-27 MED ORDER — BUPIVACAINE LIPOSOME 1.3 % IJ SUSP
20.0000 mL | INTRAMUSCULAR | Status: AC
  Filled 2015-01-27: qty 20

## 2015-01-27 MED ORDER — ONDANSETRON HCL 4 MG/2ML IJ SOLN
INTRAMUSCULAR | Status: DC | PRN
  Administered 2015-01-27: 4 mg via INTRAVENOUS

## 2015-01-27 MED ORDER — PROPOFOL 10 MG/ML IV BOLUS
INTRAVENOUS | Status: DC | PRN
  Administered 2015-01-27: 50 mg via INTRAVENOUS
  Administered 2015-01-27: 160 mg via INTRAVENOUS

## 2015-01-27 MED ORDER — SODIUM CHLORIDE 0.9 % IR SOLN
Status: DC | PRN
  Administered 2015-01-27: 10:00:00

## 2015-01-27 MED ORDER — SUGAMMADEX SODIUM 500 MG/5ML IV SOLN
INTRAVENOUS | Status: AC
  Filled 2015-01-27: qty 5

## 2015-01-27 MED ORDER — ACETAMINOPHEN 325 MG PO TABS
650.0000 mg | ORAL_TABLET | ORAL | Status: DC | PRN
Start: 2015-01-27 — End: 2015-01-28

## 2015-01-27 MED ORDER — CEFAZOLIN SODIUM-DEXTROSE 2-3 GM-% IV SOLR
2.0000 g | Freq: Three times a day (TID) | INTRAVENOUS | Status: DC
  Administered 2015-01-27 (×2): 2 g via INTRAVENOUS
  Filled 2015-01-27 (×5): qty 50

## 2015-01-27 SURGICAL SUPPLY — 86 items
.25%SENSORCAINEMPF 30 ML ×3 IMPLANT
1% LIDOCAINE 1:100000 ×2 IMPLANT
BAG DECANTER FOR FLEXI CONT (MISCELLANEOUS) ×3 IMPLANT
BENZOIN TINCTURE PRP APPL 2/3 (GAUZE/BANDAGES/DRESSINGS) ×3 IMPLANT
BIT DRILL 5.0/4.0 (BIT) ×1 IMPLANT
BLADE CLIPPER SURG (BLADE) IMPLANT
BLADE SURG 11 STRL SS (BLADE) ×3 IMPLANT
BONE ALLOSTEM MORSELIZED 5CC (Bone Implant) ×3 IMPLANT
BRUSH SCRUB EZ PLAIN DRY (MISCELLANEOUS) ×3 IMPLANT
BUR MATCHSTICK NEURO 3.0 LAGG (BURR) ×3 IMPLANT
BUR PRECISION FLUTE 6.0 (BURR) ×3 IMPLANT
CAGE RISE 11-17-15 10X26 (Cage) ×6 IMPLANT
CANISTER SUCT 3000ML PPV (MISCELLANEOUS) ×3 IMPLANT
CAP LOCKING (Cap) ×8 IMPLANT
CAP LOCKING 5.5 CREO (Cap) ×4 IMPLANT
CLOSURE WOUND 1/2 X4 (GAUZE/BANDAGES/DRESSINGS) ×1
CONT SPEC 4OZ CLIKSEAL STRL BL (MISCELLANEOUS) ×3 IMPLANT
COVER BACK TABLE 60X90IN (DRAPES) ×3 IMPLANT
DECANTER SPIKE VIAL GLASS SM (MISCELLANEOUS) ×3 IMPLANT
DEVICE DISSECT PLASMABLAD 3.0S (MISCELLANEOUS) ×1 IMPLANT
DRAPE C-ARM 42X72 X-RAY (DRAPES) ×3 IMPLANT
DRAPE C-ARMOR (DRAPES) ×3 IMPLANT
DRAPE LAPAROTOMY 100X72X124 (DRAPES) ×3 IMPLANT
DRAPE POUCH INSTRU U-SHP 10X18 (DRAPES) ×3 IMPLANT
DRAPE PROXIMA HALF (DRAPES) IMPLANT
DRAPE SURG 17X23 STRL (DRAPES) ×3 IMPLANT
DRILL 5.0/4.0 (BIT) ×3
DRSG OPSITE 4X5.5 SM (GAUZE/BANDAGES/DRESSINGS) ×3 IMPLANT
DRSG OPSITE POSTOP 4X6 (GAUZE/BANDAGES/DRESSINGS) ×3 IMPLANT
DURAPREP 26ML APPLICATOR (WOUND CARE) ×3 IMPLANT
ELECT REM PT RETURN 9FT ADLT (ELECTROSURGICAL) ×3
ELECTRODE REM PT RTRN 9FT ADLT (ELECTROSURGICAL) ×1 IMPLANT
EVACUATOR 3/16  PVC DRAIN (DRAIN) ×2
EVACUATOR 3/16 PVC DRAIN (DRAIN) ×1 IMPLANT
GAUZE SPONGE 4X4 12PLY STRL (GAUZE/BANDAGES/DRESSINGS) ×3 IMPLANT
GAUZE SPONGE 4X4 16PLY XRAY LF (GAUZE/BANDAGES/DRESSINGS) ×3 IMPLANT
GLOVE BIO SURGEON STRL SZ8 (GLOVE) ×6 IMPLANT
GLOVE BIOGEL PI IND STRL 7.0 (GLOVE) ×1 IMPLANT
GLOVE BIOGEL PI IND STRL 7.5 (GLOVE) ×1 IMPLANT
GLOVE BIOGEL PI IND STRL 8 (GLOVE) ×2 IMPLANT
GLOVE BIOGEL PI INDICATOR 7.0 (GLOVE) ×2
GLOVE BIOGEL PI INDICATOR 7.5 (GLOVE) ×2
GLOVE BIOGEL PI INDICATOR 8 (GLOVE) ×4
GLOVE ECLIPSE 6.5 STRL STRAW (GLOVE) ×6 IMPLANT
GLOVE ECLIPSE 7.5 STRL STRAW (GLOVE) ×15 IMPLANT
GLOVE EXAM NITRILE LRG STRL (GLOVE) IMPLANT
GLOVE EXAM NITRILE MD LF STRL (GLOVE) IMPLANT
GLOVE EXAM NITRILE XL STR (GLOVE) IMPLANT
GLOVE EXAM NITRILE XS STR PU (GLOVE) IMPLANT
GLOVE INDICATOR 8.5 STRL (GLOVE) ×6 IMPLANT
GLOVE SS BIOGEL STRL SZ 7 (GLOVE) ×1 IMPLANT
GLOVE SUPERSENSE BIOGEL SZ 7 (GLOVE) ×2
GOWN STRL REUS W/ TWL LRG LVL3 (GOWN DISPOSABLE) ×2 IMPLANT
GOWN STRL REUS W/ TWL XL LVL3 (GOWN DISPOSABLE) ×2 IMPLANT
GOWN STRL REUS W/TWL 2XL LVL3 (GOWN DISPOSABLE) ×3 IMPLANT
GOWN STRL REUS W/TWL LRG LVL3 (GOWN DISPOSABLE) ×4
GOWN STRL REUS W/TWL XL LVL3 (GOWN DISPOSABLE) ×4
KIT BASIN OR (CUSTOM PROCEDURE TRAY) ×3 IMPLANT
KIT ROOM TURNOVER OR (KITS) ×3 IMPLANT
LIQUID BAND (GAUZE/BANDAGES/DRESSINGS) ×3 IMPLANT
MILL MEDIUM DISP (BLADE) ×3 IMPLANT
NEEDLE HYPO 21X1.5 SAFETY (NEEDLE) ×3 IMPLANT
NEEDLE HYPO 25X1 1.5 SAFETY (NEEDLE) ×3 IMPLANT
NS IRRIG 1000ML POUR BTL (IV SOLUTION) ×3 IMPLANT
PACK LAMINECTOMY NEURO (CUSTOM PROCEDURE TRAY) ×3 IMPLANT
PAD ARMBOARD 7.5X6 YLW CONV (MISCELLANEOUS) ×9 IMPLANT
PAD SHARPS MAGNETIC DISPOSAL (MISCELLANEOUS) ×3 IMPLANT
PATTIES SURGICAL 1X1 (DISPOSABLE) ×3 IMPLANT
PLASMABLADE 3.0S (MISCELLANEOUS) ×3
ROD SPINAL 35MM (Rod) ×6 IMPLANT
SCREW MOD 6.0-5.0X35MM (Screw) ×6 IMPLANT
SCREW PREASSEMBLY 6.0-5.0X35 (Screw) ×6 IMPLANT
SPONGE LAP 4X18 X RAY DECT (DISPOSABLE) IMPLANT
SPONGE SURGIFOAM ABS GEL 100 (HEMOSTASIS) ×3 IMPLANT
STRIP CLOSURE SKIN 1/2X4 (GAUZE/BANDAGES/DRESSINGS) ×2 IMPLANT
SUT VIC AB 0 CT1 18XCR BRD8 (SUTURE) ×2 IMPLANT
SUT VIC AB 0 CT1 8-18 (SUTURE) ×4
SUT VIC AB 2-0 CT1 18 (SUTURE) ×3 IMPLANT
SUT VICRYL 4-0 PS2 18IN ABS (SUTURE) ×3 IMPLANT
SYR 20CC LL (SYRINGE) ×3 IMPLANT
TOWEL OR 17X24 6PK STRL BLUE (TOWEL DISPOSABLE) ×3 IMPLANT
TOWEL OR 17X26 10 PK STRL BLUE (TOWEL DISPOSABLE) ×3 IMPLANT
TRAY FOLEY CATH 16FRSI W/METER (SET/KITS/TRAYS/PACK) ×3 IMPLANT
TRAY FOLEY W/METER SILVER 14FR (SET/KITS/TRAYS/PACK) IMPLANT
TULIP CREP AMP 5.5MM (Orthopedic Implant) ×6 IMPLANT
WATER STERILE IRR 1000ML POUR (IV SOLUTION) ×3 IMPLANT

## 2015-01-27 NOTE — Anesthesia Procedure Notes (Signed)
Procedure Name: Intubation Date/Time: 01/27/2015 8:44 AM Performed by: ADAMI, RICHARD Pre-anesthesia Checklist: Patient identified, Timeout performed, Emergency Drugs available, Suction available and Patient being monitored Patient Re-evaluated:Patient Re-evaluated prior to inductionOxygen Delivery Method: Circle system utilized and Ambu bag Preoxygenation: Pre-oxygenation with 100% oxygen Intubation Type: IV induction Ventilation: Mask ventilation without difficulty Laryngoscope Size: Mac Grade View: Grade II Tube type: Oral Tube size: 8.0 mm Number of attempts: 1 Airway Equipment and Method: LTA kit utilized Placement Confirmation: ETT inserted through vocal cords under direct vision and breath sounds checked- equal and bilateral Secured at: 23 cm Dental Injury: Teeth and Oropharynx as per pre-operative assessment      

## 2015-01-27 NOTE — Transfer of Care (Signed)
Immediate Anesthesia Transfer of Care Note  Patient: Craig Coleman  Procedure(s) Performed: Procedure(s) with comments: Lumbar four-five posterior lumbar interbody fusion with interbody prosthesis posterior lateral arthrodesis and posterior nonsegmental instrumentation (N/A) - Lumbar four-five posterior lumbar interbody fusion with interbody prosthesis posterior lateral arthrodesis and posterior nonsegmental instrumentation  Patient Location: PACU  Anesthesia Type:General  Level of Consciousness: awake and alert   Airway & Oxygen Therapy: Patient Spontanous Breathing and Patient connected to nasal cannula oxygen  Post-op Assessment: Report given to RN and Post -op Vital signs reviewed and stable  Post vital signs: Reviewed and stable  Last Vitals:  Filed Vitals:   01/27/15 0634  BP: 132/78  Pulse: 78  Temp: 37.5 C  Resp: 20    Complications: No apparent anesthesia complications

## 2015-01-27 NOTE — H&P (Signed)
Craig Coleman is an 59 y.o. male.   Chief Complaint: Back and left greater than right leg pain HPI: Patient is very pleasant 59 year old some is a progress worsening back and bilateral leg pain worse on the left. The pain would radiate down at L4 and L5 nerve root pattern and workup revealed a grade 1 spondylolisthesis at L4-5 with severe foraminal stenosis L4 and L5 nerve roots. Patient failed all forms of conservative treatment and due to his progressive clinical syndrome imaging findings and failure conservative treatment I recommended decompression stabilization procedure at L4-5. I extensively reviewed the risks and benefits of the operation with the patient as well as perioperative course expectations of outcome and alternatives of surgery and he understands and agrees to proceed forward.  Past Medical History  Diagnosis Date  . Chronic back pain greater than 3 months duration   . Complication of anesthesia     pt. states had a difficult time waking up after colonoscopy  . Frequent urination at night   . Arthritis   . Joint pain   . GERD (gastroesophageal reflux disease)   . Insomnia   . BPH (benign prostatic hyperplasia)     Past Surgical History  Procedure Laterality Date  . Hemorrhoid surgery    . Tonsillectomy    . Colonoscopy      History reviewed. No pertinent family history. Social History:  reports that he has been smoking Cigarettes.  He has been smoking about 0.50 packs per day. He does not have any smokeless tobacco history on file. He reports that he drinks alcohol. He reports that he does not use illicit drugs.  Allergies: No Known Allergies  Medications Prior to Admission  Medication Sig Dispense Refill  . ciprofloxacin (CIPRO) 500 MG tablet Take 500 mg by mouth 2 (two) times daily.    Marland Kitchen ibuprofen (ADVIL,MOTRIN) 800 MG tablet Take 1 tablet (800 mg total) by mouth every 8 (eight) hours as needed. 30 tablet 0    No results found for this or any previous visit  (from the past 48 hour(s)). No results found.  Review of Systems  Constitutional: Negative.   HENT: Negative.   Eyes: Negative.   Respiratory: Negative.   Cardiovascular: Negative.   Gastrointestinal: Negative.   Genitourinary: Negative.   Musculoskeletal: Positive for myalgias and back pain.  Skin: Negative.   Neurological: Positive for sensory change.  Endo/Heme/Allergies: Negative.   Psychiatric/Behavioral: Negative.     Blood pressure 132/78, pulse 78, temperature 99.5 F (37.5 C), temperature source Oral, resp. rate 20, height  (1.905 m), weight 110.848 kg (244 lb 6 oz), SpO2 98 %. Physical Exam  Constitutional: He is oriented to person, place, and time. He appears well-developed and well-nourished.  HENT:  Head: Normocephalic and atraumatic.  Eyes: Pupils are equal, round, and reactive to light.  Neck: Normal range of motion.  Respiratory: Effort normal and breath sounds normal.  GI: Soft. Bowel sounds are normal.  Neurological: He is alert and oriented to person, place, and time. He has normal strength. GCS eye subscore is 4. GCS verbal subscore is 5. GCS motor subscore is 6.  Strength is 5 out of 5 in his iliopsoas, quads, hamstrings, gastric, and tibialis, and EHL.     Assessment/Plan 59 year old presents for a decompression and fusion at L4-5.  Donaciano Range P 01/27/2015, 7:47 AM

## 2015-01-27 NOTE — Op Note (Signed)
Preoperative diagnosis: Grade 1 spondylolisthesis L4-5 with severe foraminal stenosis of the L4 and L5 nerve root L4-5 instability and mechanical back pain.  Postoperative diagnosis: Same  Procedure: 1 Gill decompression L4-5 with complete me to facetectomies and radical foraminotomies of the L4 and L5 nerve roots.  #2 posterior lumbar interbody fusion L4-5 using the globus rise expandable cage system packed with locally harvested autograft mixed with allostem  #3 cortical screw fixation L4-5 using the globus Creo cortical screw set with 35 mm screws at L4 and L5.  #4 (reduction spinal deformity  Surgeon: Jillyn Hidden Chasey Dull  Asst.: Sharlet Salina Ditty  Anesthesia: Gen.  EBL: Minimal  History of present illness: Patient is a 59 years him as a progress worsening back and bilateral leg pain worse on the left consistent with an L4 and L5 nerve root and a workup revealed progressive degeneration spondylolisthesis and severe foraminal stenosis and central stenosis at L4-5. Due to patient's failure conservative treatment imaging findings progression clinical syndrome I recommended decompressive position procedure at L4-5. I extensively went over the risks and benefits of the operation with the patient as well as perioperative course expectations of outcome and alternatives of surgery and he understands and agrees to proceed forward.  Operative procedure: Patient brought into the or was induced under general anesthesia positioned prone the Wilson frame his back was prepped and draped in routine sterile fashion. X-ray localize the appropriate level so after infiltration 10 mL lidocaine with epi midline incision was made and Bovie a car was used to calcification subperiosteal dissections care lamina of L4 and L5 exposing the lateral pars and inferior facet complexes at L3-4 as well. Using AP and lateral fluoroscopy the correct level was identified and pilot holes were drilled a 5 and 7:00 position on each pedicle  pedicles were cannulated L4 probed and tapped with 50 tap probed again and 6050 screws were placed at L4. All screws excellent purchase and fluoroscopy confirmed good depth and trajectory. Then the spinous process of L4 was removed central decompression was begun the ligament was a markedly upper to feet and the facets were markedly stenotic causes severe central and foraminal stenosis. Complete medial facetectomies were performed I then under bitten drilled on the medial inferior aspect the pedicles identify the 4 root and retract this out aggressively decompressed and the 4 root distally. Then continue the foraminotomy at the 5 root coagulate the epidural veins disc space was incised bilaterally using sequential distraction with a 12 distractor placed I selected 1017 expandable 26 mm in length cages packed with the local autograft mixed. Expanded and proximal me 67 turns up to about 15 mm. This significantly opened up the disc space reduce the deformity and opened up the L4 and L5 nerve root foramen. In a similar fashion the contralateral side was prepared all endplates repaired local autograft mixed was packed centrally contralateral cage was inserted. Then intense taken the cortical screw placement at L5 and a similar fashion using a high-speed drill with drilling a 30 mm hole tapped with a 55 tap and a 6050 screws placed at L5. X-ray proved all implants in good position was an copious irrigant seems as was maintained the heads were assembled on L4 they were bandaged 35 mm rods were selected top tightening nuts were applied the foramina reinspected confirm patency Gelfoam was laid top of the dura and agonizing patter with speckled and in the muscle fascia closed with after Vicryls then injected Experrell in the fascia and speckled vancomycin patter extrafascially  close remaining layers with after Vicryls and running 4 subcuticular. Dermabond benzo and Steri-Strips were applied a drain was sewn in place and  placed and then the patient recovered in stable condition. The end of case all needle counts sponge counts were correct.

## 2015-01-27 NOTE — Progress Notes (Signed)
Utilization review completed.  

## 2015-01-27 NOTE — Anesthesia Postprocedure Evaluation (Signed)
  Anesthesia Post-op Note  Patient: Craig Coleman  Procedure(s) Performed: Procedure(s) (LRB): Lumbar four-five posterior lumbar interbody fusion with interbody prosthesis posterior lateral arthrodesis and posterior nonsegmental instrumentation (N/A)  Patient Location: PACU  Anesthesia Type: General  Level of Consciousness: awake and alert   Airway and Oxygen Therapy: Patient Spontanous Breathing  Post-op Pain: mild  Post-op Assessment: Post-op Vital signs reviewed, Patient's Cardiovascular Status Stable, Respiratory Function Stable, Patent Airway and No signs of Nausea or vomiting  Last Vitals:  Filed Vitals:   01/27/15 1430  BP:   Pulse: 69  Temp: 36.4 C  Resp: 12    Post-op Vital Signs: stable   Complications: No apparent anesthesia complications

## 2015-01-27 NOTE — Anesthesia Preprocedure Evaluation (Signed)
Anesthesia Evaluation  Patient identified by MRN, date of birth, ID band Patient awake    Reviewed: Allergy & Precautions, H&P , NPO status , Patient's Chart, lab work & pertinent test results  History of Anesthesia Complications (+) history of anesthetic complications (delayed emergence following colonoscopy)  Airway Mallampati: II  TM Distance: >3 FB Neck ROM: full    Dental no notable dental hx.    Pulmonary neg pulmonary ROS, Current Smoker,    Pulmonary exam normal breath sounds clear to auscultation       Cardiovascular negative cardio ROS Normal cardiovascular exam Rhythm:regular Rate:Normal     Neuro/Psych negative neurological ROS     GI/Hepatic Neg liver ROS, GERD  ,  Endo/Other  negative endocrine ROS  Renal/GU negative Renal ROS     Musculoskeletal  (+) Arthritis ,   Abdominal (+) + obese,   Peds  Hematology negative hematology ROS (+)   Anesthesia Other Findings BPH  Reproductive/Obstetrics negative OB ROS                             Anesthesia Physical Anesthesia Plan  ASA: II  Anesthesia Plan: General   Post-op Pain Management:    Induction: Intravenous  Airway Management Planned: Oral ETT  Additional Equipment: None  Intra-op Plan:   Post-operative Plan: Extubation in OR  Informed Consent: I have reviewed the patients History and Physical, chart, labs and discussed the procedure including the risks, benefits and alternatives for the proposed anesthesia with the patient or authorized representative who has indicated his/her understanding and acceptance.   Dental Advisory Given  Plan Discussed with: Anesthesiologist, CRNA and Surgeon  Anesthesia Plan Comments:         Anesthesia Quick Evaluation

## 2015-01-28 ENCOUNTER — Emergency Department (HOSPITAL_COMMUNITY)
Admission: EM | Admit: 2015-01-28 | Discharge: 2015-01-29 | Disposition: A | Discharge status: Home / Self Care | Payer: Medicare Other | Attending: Emergency Medicine | Admitting: Emergency Medicine

## 2015-01-28 ENCOUNTER — Encounter (HOSPITAL_COMMUNITY): Payer: Self-pay | Admitting: Emergency Medicine

## 2015-01-28 DIAGNOSIS — Z792 Long term (current) use of antibiotics: Secondary | ICD-10-CM | POA: Insufficient documentation

## 2015-01-28 DIAGNOSIS — Z86018 Personal history of other benign neoplasm: Secondary | ICD-10-CM | POA: Insufficient documentation

## 2015-01-28 DIAGNOSIS — M5440 Lumbago with sciatica, unspecified side: Secondary | ICD-10-CM

## 2015-01-28 DIAGNOSIS — Z9889 Other specified postprocedural states: Secondary | ICD-10-CM | POA: Insufficient documentation

## 2015-01-28 DIAGNOSIS — Z720 Tobacco use: Secondary | ICD-10-CM | POA: Insufficient documentation

## 2015-01-28 DIAGNOSIS — G8918 Other acute postprocedural pain: Secondary | ICD-10-CM | POA: Insufficient documentation

## 2015-01-28 DIAGNOSIS — Z8719 Personal history of other diseases of the digestive system: Secondary | ICD-10-CM | POA: Insufficient documentation

## 2015-01-28 DIAGNOSIS — G8929 Other chronic pain: Secondary | ICD-10-CM | POA: Insufficient documentation

## 2015-01-28 DIAGNOSIS — M545 Low back pain: Secondary | ICD-10-CM | POA: Insufficient documentation

## 2015-01-28 DIAGNOSIS — Z87448 Personal history of other diseases of urinary system: Secondary | ICD-10-CM | POA: Insufficient documentation

## 2015-01-28 DIAGNOSIS — M1990 Unspecified osteoarthritis, unspecified site: Secondary | ICD-10-CM | POA: Insufficient documentation

## 2015-01-28 MED ORDER — FENTANYL CITRATE (PF) 100 MCG/2ML IJ SOLN
50.0000 ug | Freq: Once | INTRAMUSCULAR | Status: AC
  Administered 2015-01-28: 50 ug via NASAL
  Filled 2015-01-28: qty 2

## 2015-01-28 MED ORDER — OXYCODONE HCL 15 MG PO TABS: ORAL_TABLET | ORAL | Status: DC

## 2015-01-28 MED ORDER — CARISOPRODOL 350 MG PO TABS: 350.0000 mg | ORAL_TABLET | Freq: Four times a day (QID) | ORAL | Status: DC

## 2015-01-28 MED ORDER — IBUPROFEN 800 MG PO TABS
800.0000 mg | ORAL_TABLET | Freq: Once | ORAL | Status: AC
  Administered 2015-01-28: 800 mg via ORAL
  Filled 2015-01-28: qty 1

## 2015-01-28 MED ORDER — HYDROMORPHONE HCL 1 MG/ML IJ SOLN
1.0000 mg | Freq: Once | INTRAMUSCULAR | Status: AC
  Administered 2015-01-28: 1 mg via INTRAVENOUS
  Filled 2015-01-28: qty 1

## 2015-01-28 MED ORDER — DEXAMETHASONE SODIUM PHOSPHATE 10 MG/ML IJ SOLN
10.0000 mg | Freq: Once | INTRAMUSCULAR | Status: AC
  Administered 2015-01-29: 10 mg via INTRAVENOUS
  Filled 2015-01-28: qty 1

## 2015-01-28 NOTE — Progress Notes (Signed)
Patient alert and oriented, mae's well, voiding adequate amount of urine, swallowing without difficulty, no c/o pain. Patient discharged home with family. Script and discharged instructions given to patient. Patient and family stated understanding of d/c instructions given and has an appointment with MD. 

## 2015-01-28 NOTE — Discharge Instructions (Signed)

## 2015-01-28 NOTE — Discharge Summary (Signed)
  Physician Discharge Summary  Patient ID: Damiel Barthold MRN: [id number] DOB/AGE: 1955-06-13 59 y.o.  Admit date: 01/27/2015 Discharge date: 01/28/2015  Admission Diagnoses: Grade 1 spondylolisthesis L4-5  Discharge Diagnoses: Same Active Problems:   Spondylolisthesis at L4-L5 level   Discharged Condition: good  Hospital Course: Patient admitted hospital underwent decompression and fusion postoperative patient did very well recovered in the floor on the floor was angling and voiding spontaneously tolerating regular diet was stable for discharge home.  Consults: Significant Diagnostic Studies: Treatments: Decompression fusion L4-5 Discharge Exam: Blood pressure 123/84, pulse 81, temperature 97.9 F (36.6 C), temperature source Oral, resp. rate 18, height  (1.905 m), weight 110.848 kg (244 lb 6 oz), SpO2 98 %. Strength out of 5 wound clean dry and intact  Disposition: Home     Medication List    TAKE these medications        carisoprodol 350 MG tablet  Commonly known as:  SOMA  Take 1 tablet (350 mg total) by mouth 4 (four) times daily.     ciprofloxacin 500 MG tablet  Commonly known as:  CIPRO  Take 500 mg by mouth 2 (two) times daily.     ibuprofen 800 MG tablet  Commonly known as:  ADVIL,MOTRIN  Take 1 tablet (800 mg total) by mouth every 8 (eight) hours as needed.     oxyCODONE 15 MG immediate release tablet  Commonly known as:  ROXICODONE  1 tablet po Q4h prn           Follow-up Information    Follow up with Bernette Seeman P, MD.   Specialty:  Neurosurgery   Contact information:   [address] [place] [address] [phone]       Signed: Mariam Dollar 01/28/2015, 7:35 AM

## 2015-01-28 NOTE — Evaluation (Signed)
Occupational Therapy Evaluation Patient Details Name: Craig Coleman MRN: [id number] DOB: 1955-11-09 Today's Date: 01/28/2015    History of Present Illness Lumbar four-five posterior lumbar interbody fusion with interbody prosthesis posterior lateral arthrodesis and posterior nonsegmental instrumentation    Clinical Impression   Pt reports he was independent with ADLs PTA. Currently pt is supervision overall for ADLs and functional mobility. Pt able to recall 2/3 back precautions, reviewed precautions. Educated pt on placing frequently used items at counter height, compensatory strategies for ADLs, back brace application and wear schedule. Pt to d/c home with 24/7 supervision from his wife. At this time, d/c pt from OT. Thank you for this referral.     Follow Up Recommendations  No OT follow up;Supervision - Intermittent    Equipment Recommendations  3 in 1 bedside comode    Recommendations for Other Services       Precautions / Restrictions Precautions Precautions: Back Precaution Comments: Pt able to recall 2/3. Reviewed all back precautions with pt Required Braces or Orthoses: Spinal Brace Restrictions Weight Bearing Restrictions: No      Mobility Bed Mobility               General bed mobility comments: Pt in recliner, returned to recliner at end of session  Transfers Overall transfer level: Needs assistance Equipment used: None Transfers: Sit to/from Stand Sit to Stand: Supervision         General transfer comment: Sit <> stand from chair x 1, toilet x 1    Balance Overall balance assessment: No apparent balance deficits (not formally assessed)                                          ADL Overall ADL's : Needs assistance/impaired Eating/Feeding: Set up;Sitting   Grooming: Wash/dry hands;Supervision/safety;Standing               Lower Body Dressing: Minimal assistance;Sit to/from stand Lower Body Dressing Details (indicate cue  type and reason): Pt unable to bring bilateral feet up to knee for donning/doffing LB Toilet Transfer: Supervision/safety;Ambulation;Comfort height toilet;Grab bars   Toileting- Clothing Manipulation and Hygiene: Supervision/safety;Sit to/from stand       Functional mobility during ADLs: Supervision/safety General ADL Comments: No family present during OT eval. Educated pt on placing frequently used items at countertop height, back brace management and wear schedule, compensatory strategies for ADLs, AE, reviewed precautions; pt verbalized understanding. Pt declined use of AE, stating that his wife could help him with LB ADLs as needed. Pt relies heavily on arm rests for sit <> stand from chair and grab bar for sit <> stand from toilet. Pt able to adhere to precautions throughout functional activities. Discussed use of shower chair for bathing, pt declined and stated that his wife would help him with bathing and getting in and out of the tub,     Vision     Perception     Praxis      Pertinent Vitals/Pain Pain Assessment: 0-10 Pain Score: 5  Pain Location: back Pain Descriptors / Indicators: Sore Pain Intervention(s): Limited activity within patient's tolerance;Monitored during session     Hand Dominance Right   Extremity/Trunk Assessment Upper Extremity Assessment Upper Extremity Assessment: Overall WFL for tasks assessed   Lower Extremity Assessment Lower Extremity Assessment: Defer to PT evaluation       Communication Communication Communication: No difficulties   Cognition Arousal/Alertness:  Awake/alert Behavior During Therapy: WFL for tasks assessed/performed Overall Cognitive Status: Within Functional Limits for tasks assessed                     General Comments       Exercises       Shoulder Instructions      Home Living Family/patient expects to be discharged to:: Private residence Living Arrangements: Spouse/significant other Available Help at  Discharge: Family;Available 24 hours/day Type of Home: House       Home Layout: One level     Bathroom Shower/Tub: Tub/shower unit Shower/tub characteristics: Engineer, building services: Standard Bathroom Accessibility: Yes   Home Equipment: None          Prior Functioning/Environment Level of Independence: Independent             OT Diagnosis: Acute pain   OT Problem List:     OT Treatment/Interventions:      OT Goals(Current goals can be found in the care plan section) Acute Rehab OT Goals Patient Stated Goal: to go home today OT Goal Formulation: With patient  OT Frequency:     Barriers to D/C:            Co-evaluation              End of Session Equipment Utilized During Treatment: Back brace Nurse Communication: Other (comment) (pt needs 3 in 1 before d/c )  Activity Tolerance: Patient tolerated treatment well Patient left: in chair;with call bell/phone within reach   Time: 1610-9604 OT Time Calculation (min): 16 min Charges:  OT General Charges $OT Visit: 1 Procedure OT Evaluation $Initial OT Evaluation Tier I: 1 Procedure G-Codes:     Gaye Alken M.S., OTR/L Pager: [phone]  01/28/2015, 9:18 AM

## 2015-01-28 NOTE — Progress Notes (Signed)
Patient ID: Craig Coleman, male   DOB: 1955-06-27, 59 y.o.   MRN: [id number] Doing well no leg pain back pain controlled voiding and ambulating  Strength out of 5 wound clean dry and intact  Discharge home

## 2015-01-28 NOTE — ED Notes (Signed)
Pt. reports low back pain radiating to both legs onset today after he was discharged this afternoon at short stay unit , he had a lumbar surgery yesterday by Dr. Wynetta Emery . Pt. stated he was prescribed with Oxycodone with no relief. Denies fever .

## 2015-01-28 NOTE — ED Provider Notes (Signed)
CSN: [id number]     Arrival date & time 01/28/15  1848 History   First MD Initiated Contact with Patient 01/28/15 2103     Chief Complaint  Patient presents with  . Post-op Problem   Craig Coleman is a 59 y.o. male with a past medical history significant for BPH, GERD, arthritis, and chronic back pain status post back surgery performed yesterday and discharged today who presents with worsening back pain. The patient reports that he was discharged this afternoon from the neurosurgery post surgical service and his pain was moderately controlled at discharge. The patient says that he was able to ambulate with a mild pain however, after arriving home, he started having worsening discomfort. The patient scrubbed his pain is "100/10" and located all across his low back. The patient describes the pain as radiating down his bilateral lower extremities on the backs of his legs. The patient describes the pain as shooting. The patient says that walking and movement makes the pain worse. The patient denies any loss of bowel or bladder function, any numbness or tingling that is changed from prior.   (Consider location/radiation/quality/duration/timing/severity/associated sxs/prior Treatment) Patient is a 59 y.o. male presenting with back pain. The history is provided by the patient, the spouse and medical records. No language interpreter was used.  Back Pain Location:  Lumbar spine Quality:  Aching, stabbing and shooting Radiates to:  L posterior upper leg, R posterior upper leg, L knee, R knee, L foot and R foot Pain severity:  Severe Pain is:  Same all the time Onset quality:  Gradual Duration:  1 day Timing:  Constant Progression:  Worsening Chronicity:  New Context comment:  Recent back surgery with neurosurgery Relieved by:  Nothing Worsened by:  Movement, standing, twisting and touching Ineffective treatments:  Narcotics and lying down Associated symptoms: leg pain   Associated symptoms: no  abdominal pain, no chest pain, no dysuria, no fever, no headaches, no numbness, no paresthesias, no pelvic pain, no perianal numbness and no weakness   Risk factors: recent surgery     Past Medical History  Diagnosis Date  . Chronic back pain greater than 3 months duration   . Complication of anesthesia     pt. states had a difficult time waking up after colonoscopy  . Frequent urination at night   . Arthritis   . Joint pain   . GERD (gastroesophageal reflux disease)   . Insomnia   . BPH (benign prostatic hyperplasia)    Past Surgical History  Procedure Laterality Date  . Hemorrhoid surgery    . Tonsillectomy    . Colonoscopy    . Back surgery     No family history on file. Social History  Substance Use Topics  . Smoking status: Current Every Day Smoker -- 0.00 packs/day    Types: Cigarettes  . Smokeless tobacco: None  . Alcohol Use: Yes    Review of Systems  Unable to perform ROS Constitutional: Negative for fever, chills, diaphoresis, activity change and fatigue.  HENT: Negative for congestion and rhinorrhea.   Respiratory: Negative for cough, chest tightness, shortness of breath and stridor.   Cardiovascular: Negative for chest pain.  Gastrointestinal: Negative for nausea, vomiting, abdominal pain, diarrhea and constipation.  Genitourinary: Negative for dysuria, frequency, flank pain and pelvic pain.  Musculoskeletal: Positive for back pain. Negative for neck pain and neck stiffness.  Skin: Positive for wound (Surgical wound on back still dressed from procedure yesterday.). Negative for rash.  Neurological: Negative for weakness,  numbness, headaches and paresthesias.  Psychiatric/Behavioral: Negative for agitation.  All other systems reviewed and are negative.     Allergies  Review of patient's allergies indicates no known allergies.  Home Medications   Prior to Admission medications   Medication Sig Start Date End Date Taking? Authorizing Provider    carisoprodol (SOMA) 350 MG tablet Take 1 tablet (350 mg total) by mouth 4 (four) times daily. 01/28/15   Donalee Citrin, MD  ciprofloxacin (CIPRO) 500 MG tablet Take 500 mg by mouth 2 (two) times daily.    Historical Provider, MD  ibuprofen (ADVIL,MOTRIN) 800 MG tablet Take 1 tablet (800 mg total) by mouth every 8 (eight) hours as needed. 06/15/14   Nelva Nay, MD  oxyCODONE (ROXICODONE) 15 MG immediate release tablet 1 tablet po Q4h prn 01/28/15   Donalee Citrin, MD   BP 136/91 mmHg  Pulse 95  Temp(Src) 98 F (36.7 C) (Oral)  SpO2 93% Physical Exam  Constitutional: He is oriented to person, place, and time. He appears well-developed and well-nourished. No distress.  HENT:  Head: Normocephalic.  Mouth/Throat: No oropharyngeal exudate.  Eyes: Conjunctivae and EOM are normal. Pupils are equal, round, and reactive to light.  Neck: Normal range of motion.  Cardiovascular: Normal rate, normal heart sounds and intact distal pulses.   No murmur heard. Pulmonary/Chest: Effort normal and breath sounds normal. No stridor. No respiratory distress. He has no wheezes. He exhibits no tenderness.  Abdominal: Soft. He exhibits no distension. There is no tenderness.  Musculoskeletal: He exhibits tenderness.       Lumbar back: He exhibits tenderness.       Back:  Neurological: He is alert and oriented to person, place, and time. He displays no tremor and normal reflexes. No cranial nerve deficit or sensory deficit. He exhibits normal muscle tone. Gait normal. GCS eye subscore is 4. GCS verbal subscore is 5. GCS motor subscore is 6.  Patient neurovascularly intact in his bilateral lower extremity. Patient does not have any numbness tingling or weakness. Patient was observed to ambulate without difficulty.  Skin: Skin is warm. He is not diaphoretic. No erythema.  Psychiatric: He has a normal mood and affect.  Nursing note and vitals reviewed.   ED Course  Procedures (including critical care time) Labs  Review Labs Reviewed - No data to display  Imaging Review Dg Lumbar Spine 2-3 Views  01/27/2015   CLINICAL DATA:  Imaging for lumbar spine posterior fusion surgery. Fluoro time 1 minutes and 19 seconds.  EXAM: DG C-ARM 61-120 MIN; LUMBAR SPINE - 2-3 VIEW  COMPARISON:  Lumbar CT, 12/18/2014  FINDINGS: Two images show pedicle screw was inserted at L4 and L5. Well-centered intervertebral cages maintain disc height the L4-L5 level. A grade 1 anterolisthesis persists of L4 on L5, stable from the prior CT.  IMPRESSION: Operative imaging for L4-L5 posterior lumbar spine fusion surgery.   Electronically Signed   By: Amie Portland M.D.   On: 01/27/2015 12:28   Dg C-arm 61-120 Min  01/27/2015   CLINICAL DATA:  Imaging for lumbar spine posterior fusion surgery. Fluoro time 1 minutes and 19 seconds.  EXAM: DG C-ARM 61-120 MIN; LUMBAR SPINE - 2-3 VIEW  COMPARISON:  Lumbar CT, 12/18/2014  FINDINGS: Two images show pedicle screw was inserted at L4 and L5. Well-centered intervertebral cages maintain disc height the L4-L5 level. A grade 1 anterolisthesis persists of L4 on L5, stable from the prior CT.  IMPRESSION: Operative imaging for L4-L5 posterior lumbar spine fusion surgery.  Electronically Signed   By: Amie Portland M.D.   On: 01/27/2015 12:28   I have personally reviewed and evaluated these images and lab results as part of my medical decision-making.   EKG Interpretation None      MDM   Craig Coleman is a 59 y.o. male with a past medical history significant for BPH, GERD, arthritis, and chronic back pain status post back surgery performed yesterday and discharged today who presents with worsening back pain.  Patient neurovascularly intact on exam and has tenderness to palpation on his back. Patient did not give any red flags concerning for spinal compromise including no loss of bowel or bladder function. The patient denied any significant trauma following his surgery.  The patient was given a dose of  Dilaudid and reassessed. The patient continued to have no significant neurological findings. The neurosurgery team was called given the patient's procedure yesterday and they recommended administration of Decadron and Valium. The patient reported his pain that was initially a 100 out of 10 headache improved to a 6 out of 10. The patient was counseled that his pain may not be completely resolved however, he feels that he is doing much better after medicine.  The neurosurgery team felt that the patient would do well adding Valium to his home pain management regimen. The patient was informed that the Decadron will continue to decrease his inflammation and they felt comfortable with taking the patient home for outpatient management of his symptoms. They report that they will follow up with the neurosurgery team as previously scheduled and return to the emergency department if he begins developing any new or worsening symptoms.   The patient was able to ambulate without difficulty prior to leaving the ED.  The patient and his family had no further questions, concerns, or complaints and the patient was discharged in good condition with significant improvement in his presenting symptoms of pain.  This patient was seen with Dr. Madilyn Hook, emergency medicine attending.   Final diagnoses:  Low back pain with sciatica, sciatica laterality unspecified, unspecified back pain laterality       Theda Belfast, MD 01/29/15 1610  Tilden Fossa, MD 01/30/15 1311

## 2015-01-28 NOTE — Evaluation (Signed)
Physical Therapy Evaluation and Discharge Patient Details Name: Craig [place] MRN: [id number] DOB: [birth year] Today's Date: 01/28/2015   History of Present Illness  Pt is a 59 y/o male who presents s/p L4-5 PLIF on 01/28/15.  Clinical Impression  Patient evaluated by Physical Therapy with no further acute PT needs identified. All education has been completed and the patient has no further questions. At the time of PT eval pt was able to perform transfers and ambulation at a gross modified independent level. See below for any follow-up Physial Therapy or equipment needs. PT is signing off. Thank you for this referral.     Follow Up Recommendations Outpatient PT;Supervision - Intermittent (When appropriate per post-op protocol)    Equipment Recommendations  None recommended by PT    Recommendations for Other Services       Precautions / Restrictions Precautions Precautions: Back;Fall Precaution Booklet Issued: Yes (comment) Precaution Comments: Discussed 3/3 back precautions with pt Required Braces or Orthoses: Spinal Brace Spinal Brace: Lumbar corset;Applied in sitting position Restrictions Weight Bearing Restrictions: No      Mobility  Bed Mobility Overal bed mobility: Modified Independent             General bed mobility comments: Pt was instructed in log roll and demonstrated proper form with increased time.   Transfers Overall transfer level: Needs assistance Equipment used: None Transfers: Sit to/from Stand Sit to Stand: Modified independent (Device/Increase time)         General transfer comment: Slow and guarded movements, was able to achieve sit<>stand without assist and good form  Ambulation/Gait Ambulation/Gait assistance: Modified independent (Device/Increase time) Ambulation Distance (Feet): 400 Feet Assistive device: None Gait Pattern/deviations: Step-through pattern;Decreased stride length;Wide base of support Gait velocity: Decreased Gait  velocity interpretation: Below normal speed for age/gender General Gait Details: Pt was able to ambulate with no LOB or need for UE support. Grossly slow and guarded gait.   Stairs Stairs: Yes Stairs assistance: Modified independent (Device/Increase time) Stair Management: One rail Right;Step to pattern;Forwards Number of Stairs: 2 General stair comments: No assist required. Pt with safe technique  Wheelchair Mobility    Modified Rankin (Stroke Patients Only)       Balance Overall balance assessment: No apparent balance deficits (not formally assessed)                                           Pertinent Vitals/Pain Pain Assessment: 0-10 Pain Score: 5  Pain Location: Incision Pain Descriptors / Indicators: Operative site guarding Pain Intervention(s): Limited activity within patient's tolerance;Monitored during session;Repositioned    Home Living Family/patient expects to be discharged to:: Private residence Living Arrangements: Spouse/significant other Available Help at Discharge: Family;Available 24 hours/day Type of Home: House       Home Layout: One level Home Equipment: None      Prior Function Level of Independence: Independent               Hand Dominance   Dominant Hand: Right    Extremity/Trunk Assessment   Upper Extremity Assessment: Defer to OT evaluation           Lower Extremity Assessment: Overall WFL for tasks assessed      Cervical / Trunk Assessment: Normal  Communication   Communication: No difficulties  Cognition Arousal/Alertness: Awake/alert Behavior During Therapy: WFL for tasks assessed/performed Overall Cognitive Status: Within Functional Limits for tasks assessed  General Comments      Exercises        Assessment/Plan    PT Assessment Patent does not need any further PT services  PT Diagnosis Acute pain   PT Problem List    PT Treatment Interventions     PT  Goals (Current goals can be found in the Care Plan section) Acute Rehab PT Goals Patient Stated Goal: to go home today PT Goal Formulation: All assessment and education complete, DC therapy    Frequency     Barriers to discharge        Co-evaluation               End of Session Equipment Utilized During Treatment: Back brace Activity Tolerance: Patient tolerated treatment well Patient left: in chair;with call bell/phone within reach Nurse Communication: Mobility status         Time: 4098-1191 PT Time Calculation (min) (ACUTE ONLY): 24 min   Charges:   PT Evaluation $Initial PT Evaluation Tier I: 1 Procedure PT Treatments $Gait Training: 8-22 mins   PT G Codes:        Conni Slipper 02/13/15, 9:20 AM   Conni Slipper, PT, DPT Acute Rehabilitation Services Pager: [phone]

## 2015-01-29 MED ORDER — DIAZEPAM 5 MG PO TABS: 5.0000 mg | ORAL_TABLET | Freq: Three times a day (TID) | ORAL | Status: DC | PRN

## 2015-01-29 MED ORDER — DIAZEPAM 5 MG PO TABS
5.0000 mg | ORAL_TABLET | Freq: Once | ORAL | Status: AC
  Administered 2015-01-29: 5 mg via ORAL
  Filled 2015-01-29: qty 1

## 2015-01-29 NOTE — Progress Notes (Signed)
Patient was seen and examined. He is complaining of back pain. Some leg pain. His pain was severe but after dilaudid it is down to a 7 out of 10. I find no weakness. Dressing is dry. We are going to try Decadron and Valium.

## 2015-01-29 NOTE — ED Notes (Signed)
Pt verbalized understanding of d/c instructions and has no further questions. Pt stable and NAD. Pt states "My back feels much better"

## 2015-01-29 NOTE — Discharge Instructions (Signed)

## 2015-07-24 ENCOUNTER — Encounter (HOSPITAL_COMMUNITY): Payer: Self-pay | Admitting: Emergency Medicine

## 2015-07-24 ENCOUNTER — Emergency Department (HOSPITAL_COMMUNITY)
Admission: EM | Admit: 2015-07-24 | Discharge: 2015-07-24 | Disposition: A | Discharge status: Home / Self Care | Payer: Medicare Other | Attending: Emergency Medicine | Admitting: Emergency Medicine

## 2015-07-24 DIAGNOSIS — Z87438 Personal history of other diseases of male genital organs: Secondary | ICD-10-CM | POA: Diagnosis not present

## 2015-07-24 DIAGNOSIS — K047 Periapical abscess without sinus: Secondary | ICD-10-CM | POA: Diagnosis not present

## 2015-07-24 DIAGNOSIS — R51 Headache: Secondary | ICD-10-CM | POA: Diagnosis present

## 2015-07-24 DIAGNOSIS — K029 Dental caries, unspecified: Secondary | ICD-10-CM | POA: Insufficient documentation

## 2015-07-24 DIAGNOSIS — G8929 Other chronic pain: Secondary | ICD-10-CM | POA: Diagnosis not present

## 2015-07-24 DIAGNOSIS — F17210 Nicotine dependence, cigarettes, uncomplicated: Secondary | ICD-10-CM | POA: Insufficient documentation

## 2015-07-24 DIAGNOSIS — Z792 Long term (current) use of antibiotics: Secondary | ICD-10-CM | POA: Diagnosis not present

## 2015-07-24 DIAGNOSIS — Z79899 Other long term (current) drug therapy: Secondary | ICD-10-CM | POA: Insufficient documentation

## 2015-07-24 DIAGNOSIS — M1990 Unspecified osteoarthritis, unspecified site: Secondary | ICD-10-CM | POA: Diagnosis not present

## 2015-07-24 MED ORDER — PENICILLIN V POTASSIUM 250 MG PO TABS
500.0000 mg | ORAL_TABLET | Freq: Four times a day (QID) | ORAL | Status: DC
  Administered 2015-07-24: 500 mg via ORAL
  Filled 2015-07-24: qty 2

## 2015-07-24 MED ORDER — IBUPROFEN 800 MG PO TABS: 800.0000 mg | ORAL_TABLET | Freq: Three times a day (TID) | ORAL | Status: DC

## 2015-07-24 MED ORDER — BUPIVACAINE HCL (PF) 0.5 % IJ SOLN
30.0000 mL | Freq: Once | INTRAMUSCULAR | Status: AC
  Administered 2015-07-24: 30 mL
  Filled 2015-07-24: qty 30

## 2015-07-24 MED ORDER — PENICILLIN V POTASSIUM 500 MG PO TABS: 500.0000 mg | ORAL_TABLET | Freq: Three times a day (TID) | ORAL | Status: DC

## 2015-07-24 NOTE — ED Notes (Signed)
Patient reports left side of face began hurting severely Friday night. Reports gums, teeth, nose, entire left side of face. Rates pain 10/10.

## 2015-07-24 NOTE — ED Provider Notes (Addendum)
CSN: [id number]     Arrival date & time 07/24/15  0510 History   First MD Initiated Contact with Patient 07/24/15 [phone]     Chief Complaint  Patient presents with  . Facial Pain      The history is provided by the patient.  Patient presents to the emergency department with complaints of upper dental pain over the past 24-48 hours.  He now feels like his pain and swelling of his left upper lip and towards the left nostril.  No drainage.  No difficulty breathing or swallowing.  Pain is mild to moderate in severity.  Tried ibuprofen at home without improvement in his symptoms  Past Medical History  Diagnosis Date  . Chronic back pain greater than 3 months duration   . Complication of anesthesia     pt. states had a difficult time waking up after colonoscopy  . Frequent urination at night   . Arthritis   . Joint pain   . GERD (gastroesophageal reflux disease)   . Insomnia   . BPH (benign prostatic hyperplasia)    Past Surgical History  Procedure Laterality Date  . Hemorrhoid surgery    . Tonsillectomy    . Colonoscopy    . Back surgery     No family history on file. Social History  Substance Use Topics  . Smoking status: Current Every Day Smoker -- 0.00 packs/day    Types: Cigarettes  . Smokeless tobacco: None  . Alcohol Use: Yes    Review of Systems  All other systems reviewed and are negative.     Allergies  Review of patient's allergies indicates no known allergies.  Home Medications   Prior to Admission medications   Medication Sig Start Date End Date Taking? Authorizing Provider  carisoprodol (SOMA) 350 MG tablet Take 1 tablet (350 mg total) by mouth 4 (four) times daily. 01/28/15   Donalee Citrin, MD  ciprofloxacin (CIPRO) 500 MG tablet Take 500 mg by mouth 2 (two) times daily.    Historical Provider, MD  diazepam (VALIUM) 5 MG tablet Take 1 tablet (5 mg total) by mouth every 8 (eight) hours as needed for muscle spasms. 01/29/15   Theda Belfast, MD  ibuprofen  (ADVIL,MOTRIN) 800 MG tablet Take 1 tablet (800 mg total) by mouth 3 (three) times daily. 07/24/15   Azalia Bilis, MD  oxyCODONE (ROXICODONE) 15 MG immediate release tablet 1 tablet po Q4h prn 01/28/15   Donalee Citrin, MD  penicillin v potassium (VEETID) 500 MG tablet Take 1 tablet (500 mg total) by mouth 3 (three) times daily. 07/24/15   Azalia Bilis, MD   BP 154/98 mmHg  Pulse 72  Temp(Src) 97.8 F (36.6 C) (Oral)  Resp 16  SpO2 99% Physical Exam  Constitutional: He is oriented to person, place, and time. He appears well-developed and well-nourished.  HENT:  Head: Normocephalic.  Dental [place] present on tooth #9 with gingival tenderness without fluctuance or drainage.  No significant facial swelling  Eyes: EOM are normal.  Neck: Normal range of motion.  Pulmonary/Chest: Effort normal.  Abdominal: He exhibits no distension.  Musculoskeletal: Normal range of motion.  Neurological: He is alert and oriented to person, place, and time.  Psychiatric: He has a normal mood and affect.  Nursing note and vitals reviewed.   ED Course  Procedures (including critical care time)    DENTAL NERVE BLOCK Performed by: Lyanne Co Consent: Verbal consent obtained. Required items: required blood products, implants, devices, and special equipment available Time out:  Immediately prior to procedure a "time out" was called to verify the correct patient, procedure, equipment, support staff and site/side marked as required. Indication: Dental pain  Nerve block body site: Dental nerve of tooth #9  Preparation: Patient was prepped and draped in the usual sterile fashion. Needle gauge: 24 G Location technique: anatomical landmarks Local anesthetic: Bupivacaine 0.5%  Anesthetic total: 3 ml Outcome: pain improved Patient tolerance: Patient tolerated the procedure well with no immediate complications.     Labs Review Labs Reviewed - No data to display  Imaging Review No results found. I have  personally reviewed and evaluated these images and lab results as part of my medical decision-making.   EKG Interpretation None      MDM   Final diagnoses:  Dental infection    Dental Pain. Home with antibiotics and pain medicine. Recommend dental follow up. No signs of gingival abscess. Tolerating secretions. Airway patent. No sub lingular swelling     Azalia BilisKevin Dijuan Sleeth, MD 07/24/15 [id number]  Azalia BilisKevin Ashya Nicolaisen, MD 07/24/15 [phone]

## 2015-07-24 NOTE — Discharge Instructions (Signed)
Dental Pain °Dental pain may be caused by many things, including: °· Tooth decay (cavities or caries). Cavities expose the nerve of your tooth to air and hot or cold temperatures. This can cause pain or discomfort. °· Abscess or infection. A dental abscess is a collection of infected pus from a bacterial infection in the inner part of the tooth (pulp). It usually occurs at the end of the tooth's root. °· Injury. °· An unknown reason (idiopathic). °Your pain may be mild or severe. It may only occur when: °· You are chewing. °· You are exposed to hot or cold temperature. °· You are eating or drinking sugary foods or beverages, such as soda or candy. °Your pain may also be constant. °HOME CARE INSTRUCTIONS °Watch your dental pain for any changes. The following actions may help to lessen any discomfort that you are feeling: °· Take medicines only as directed by your dentist. °· If you were prescribed an antibiotic medicine, finish all of it even if you start to feel better. °· Keep all follow-up visits as directed by your dentist. This is important. °· Do not apply heat to the outside of your face. °· Rinse your mouth or gargle with salt water if directed by your dentist. This helps with pain and swelling. °¨ You can make salt water by adding ¼ tsp of salt to 1 cup of warm water. °· Apply ice to the painful area of your face: °¨ Put ice in a plastic bag. °¨ Place a towel between your skin and the bag. °¨ Leave the ice on for 20 minutes, 2-3 times per day. °· Avoid foods or drinks that cause you pain, such as: °¨ Very hot or very cold foods or drinks. °¨ Sweet or sugary foods or drinks. °SEEK MEDICAL CARE IF: °· Your pain is not controlled with medicines. °· Your symptoms are worse. °· You have new symptoms. °SEEK IMMEDIATE MEDICAL CARE IF: °· You are unable to open your mouth. °· You are having trouble breathing or swallowing. °· You have a fever. °· Your face, neck, or jaw is swollen. °  °This information is not  intended to replace advice given to you by your health care provider. Make sure you discuss any questions you have with your health care provider. °  °Document Released: 04/10/2005 Document Revised: 08/25/2014 Document Reviewed: 04/06/2014 °Elsevier Interactive Patient Education ©2016 Elsevier Inc. ° °Community Resource Guide Dental °The United Way’s “211” is a great source of information about community services available.  Access by dialing 2-1-1 from anywhere in [place], or by website -  www.nc211.org.  ° °Other Local Resources (Updated 04/2015) ° °Dental  Care °  °Services ° °  °Phone Number and Address  °Cost  °[facility] For children 0 - 21 years of age:  °• Cleaning °• Tooth brushing/flossing instruction °• Sealants, fillings, crowns °• Extractions °• Emergency treatment  [phone] °[address] °, [place] [address] Charges based on family income.  Medicaid and some insurance plans accepted.   °  °[facility] [place] • Cleaning °• Sealants, fillings, crowns °• Extractions °• Emergency treatment [phone] °[address] °[place], [place] ° Pregnant women 18 years of age or older with a Medicaid card  °[facility] [place] • Cleaning °• Sealants, fillings, crowns °• Extractions °• Emergency treatment [phone] °[address] °[place],  Pregnant women 18 years of age or older with a   Medicaid card  °[facility] For children 0 - 21 years of age:  °• Cleaning °• Tooth brushing/flossing instruction °• Sealants, fillings, crowns °• Extractions °• Emergency treatment °Limited orthodontic services for patients with Medicaid [phone] °[address] °[place], [place] [address] Medicaid and [place] Health Choice cover for children up to age 21 and pregnant women.  Parents of children up to age 21 without Medicaid pay a  reduced fee at time of service.  °[facility] [place] For children 0 - 21 years of age:  °• Cleaning °• Tooth brushing/flossing instruction °• Sealants, fillings, crowns °• Extractions °• Emergency treatment °Limited orthodontic services for patients with Medicaid [phone] °[address] °[place], [place].  Medicaid and [place] Health Choice cover for children up to age 21 and pregnant women.  Parents of children up to age 21 without Medicaid pay a reduced fee.  °[facility] • Cleaning °• Sealants, fillings, crowns °• Extractions ° °Hours: Tuesdays and Thursdays, 4:15 - 8 pm [phone] °[address] °[place], [place] [address] Services free of charge to [place] residents ages 18-64 who do not have health insurance, Medicare, Medicaid, or VA benefits and fall within federal poverty guidelines  °[facility] ° ° ° Provides dental care in addition to primary medical care, nutritional counseling, and pharmacy: °• Cleaning °• Sealants, fillings, crowns °• Extractions ° ° ° ° ° ° ° ° ° ° ° ° ° ° ° ° ° [phone] °[facility], [address] °[place], [place] ° °[phone] °[facility], [address] [place], [place] ° °[phone] °[facility] °[place], [place] ° °[phone] °[facility], [address] °[place], [place] ° °[phone] °[facility] °[address] °[place], [place] Accepts Medicaid, Medicare, most insurance.  Also provides services available to all with fees adjusted based on ability to pay.    °[facility] • Cleaning °• Tooth brushing/flossing instruction °• Sealants, fillings, crowns °• Extractions °• Emergency treatment °Hours: Tuesdays, Thursdays, and Fridays from 8 am to 5 pm by appointment only. [phone] °[address] °[place], [place]  [address] [place] residents with Medicaid (depending on eligibility) and children with [place] Health Choice - call for more information.  °[facility] • Extractions only ° °Hours: 2nd and 4th Thursday of each month from 6:30 am - 9 am.   [phone] ext. [phone] °[address] °[place], [place] [address] Ages 18 and older only.  Patients are seen on a first come, first served basis.  °[facility] • Cleanings °• Fillings °• Extractions °• Orthodontics °• Endodontics °• Implants/Crowns/Bridges °• Complete and partial dentures [phone] °[place], [place] Patients must complete an application for services.  There is often a waiting list.   ° °

## 2015-11-11 ENCOUNTER — Encounter (HOSPITAL_COMMUNITY): Payer: Self-pay | Admitting: Emergency Medicine

## 2015-11-11 DIAGNOSIS — F17210 Nicotine dependence, cigarettes, uncomplicated: Secondary | ICD-10-CM | POA: Insufficient documentation

## 2015-11-11 DIAGNOSIS — R220 Localized swelling, mass and lump, head: Secondary | ICD-10-CM | POA: Diagnosis present

## 2015-11-11 DIAGNOSIS — K047 Periapical abscess without sinus: Secondary | ICD-10-CM | POA: Diagnosis not present

## 2015-11-11 NOTE — ED Notes (Signed)
Pt. reports swellling at upper lip, upper gums and " knot" at nose this evening , no drainage , respirations unlabored /airway intact .

## 2015-11-12 ENCOUNTER — Emergency Department (HOSPITAL_COMMUNITY)
Admission: EM | Admit: 2015-11-12 | Discharge: 2015-11-12 | Disposition: A | Discharge status: Home / Self Care | Payer: Medicare Other | Attending: Emergency Medicine | Admitting: Emergency Medicine

## 2015-11-12 DIAGNOSIS — K047 Periapical abscess without sinus: Secondary | ICD-10-CM

## 2015-11-12 MED ORDER — PENICILLIN V POTASSIUM 500 MG PO TABS: 500.0000 mg | ORAL_TABLET | Freq: Four times a day (QID) | ORAL | Status: AC

## 2015-11-12 MED ORDER — PENICILLIN V POTASSIUM 250 MG PO TABS
500.0000 mg | ORAL_TABLET | Freq: Four times a day (QID) | ORAL | Status: DC
  Administered 2015-11-12: 500 mg via ORAL
  Filled 2015-11-12: qty 2

## 2015-11-12 MED ORDER — IBUPROFEN 200 MG PO TABS
600.0000 mg | ORAL_TABLET | Freq: Once | ORAL | Status: AC
  Administered 2015-11-12: 600 mg via ORAL
  Filled 2015-11-12: qty 1

## 2015-11-12 MED ORDER — IBUPROFEN 600 MG PO TABS: 600.0000 mg | ORAL_TABLET | Freq: Four times a day (QID) | ORAL | Status: DC | PRN

## 2015-11-12 NOTE — ED Provider Notes (Signed)
CSN: [id number]     Arrival date & time 11/11/15  2338 History   First MD Initiated Contact with Patient 11/12/15 0056     Chief Complaint  Patient presents with  . Oral Swelling    HPI   60 year old male presents today with oral swelling. Patient reports that symptoms started earlier this week with pain in his left incisor. He reports that this morning he had very minor swelling to his gums and upper lip. Patient denies any fever or chills, nausea or vomiting, pain to the sinuses or nose. Patient reports history of prior dental problems. Patient does not have a dentist. He notes that he took penicillin leftover from previous infection 2 days ago, and ibuprofen prior to arrival.  Past Medical History  Diagnosis Date  . Chronic back pain greater than 3 months duration   . Complication of anesthesia     pt. states had a difficult time waking up after colonoscopy  . Frequent urination at night   . Arthritis   . Joint pain   . GERD (gastroesophageal reflux disease)   . Insomnia   . BPH (benign prostatic hyperplasia)    Past Surgical History  Procedure Laterality Date  . Hemorrhoid surgery    . Tonsillectomy    . Colonoscopy    . Back surgery     No family history on file. Social History  Substance Use Topics  . Smoking status: Current Every Day Smoker -- 0.00 packs/day    Types: Cigarettes  . Smokeless tobacco: None  . Alcohol Use: Yes    Review of Systems  All other systems reviewed and are negative.   Allergies  Review of patient's allergies indicates no known allergies.  Home Medications   Prior to Admission medications   Medication Sig Start Date End Date Taking? Authorizing Provider  carisoprodol (SOMA) 350 MG tablet Take 1 tablet (350 mg total) by mouth 4 (four) times daily. 01/28/15   Donalee Citrin, MD  ciprofloxacin (CIPRO) 500 MG tablet Take 500 mg by mouth 2 (two) times daily.    Historical Provider, MD  diazepam (VALIUM) 5 MG tablet Take 1 tablet (5 mg total) by  mouth every 8 (eight) hours as needed for muscle spasms. 01/29/15   Canary Brim Tegeler, MD  ibuprofen (ADVIL,MOTRIN) 600 MG tablet Take 1 tablet (600 mg total) by mouth every 6 (six) hours as needed. 11/12/15   Eyvonne Mechanic, PA-C  oxyCODONE (ROXICODONE) 15 MG immediate release tablet 1 tablet po Q4h prn 01/28/15   Donalee Citrin, MD  penicillin v potassium (VEETID) 500 MG tablet Take 1 tablet (500 mg total) by mouth 4 (four) times daily. 11/12/15 11/19/15  Tinnie Gens Lakysha Kossman, PA-C   BP 149/105 mmHg  Pulse 71  Temp(Src) 99 F (37.2 C) (Oral)  Resp 16  Ht  (1.905 m)  Wt 108.41 kg  BMI 29.87 kg/m2  SpO2 99% Physical Exam  Constitutional: He is oriented to person, place, and time. He appears well-developed and well-nourished.  HENT:  Head: Normocephalic and atraumatic.  No obvious asymmetry of the face on external exam, obvious swelling to the gumline above the left upper incisor, fluctuance noted, remainder of, Nontender no signs of infection, no signs of intraoral swelling or edema other than that noted above  Eyes: Conjunctivae are normal. Pupils are equal, round, and reactive to light. Right eye exhibits no discharge. Left eye exhibits no discharge. No scleral icterus.  Neck: Normal range of motion. No JVD present. No tracheal deviation present.  Pulmonary/Chest: Effort normal. No stridor.  Neurological: He is alert and oriented to person, place, and time. Coordination normal.  Skin: Skin is warm and dry. No rash noted. No erythema. No pallor.  Psychiatric: He has a normal mood and affect. His behavior is normal. Judgment and thought content normal.  Nursing note and vitals reviewed.   ED Course  Procedures (including critical care time)  INCISION AND DRAINAGE Performed by: Thermon LeylandHedges,Keshia Weare Todd Consent: Verbal consent obtained. Risks and benefits: risks, benefits and alternatives were discussed Type: abscess  Body area: Left upper incisor  Anesthesia: local infiltration  Incision  was made with a scalpel.  Local anesthetic: none   Complexity: complex Blunt dissection to break up loculations  Drainage: purulent  Drainage amount: Unable to completely visualize as this was intraoral and patient was closing his mouth to spit out discharge   Patient tolerance: Patient tolerated the procedure well with no immediate complications.   Labs Review Labs Reviewed - No data to display  Imaging Review No results found. I have personally reviewed and evaluated these images and lab results as part of my medical decision-making.   EKG Interpretation None      MDM   Final diagnoses:  Dental abscess    Labs:  Imaging:  Consults:  Therapeutics:Penicillin, ibuprofen  Discharge Meds: Penicillin, ibuprofen  Assessment/Plan: 60 year old male presents today with dental abscess. I&D performed at bedside with Improvement in patient's pain symptoms. Patient has no signs of significant surrounding infection, he will be placed on antibiotics, given follow-up resources for dentists in the area. Patient is instructed to see dentist as soon as possible, return if he develops any new or worsening signs or symptoms. Patient verbalized understanding and agreement to today's plan had no further questions or concerns at time of discharge       Eyvonne MechanicJeffrey Tyesha Joffe, PA-C 11/12/15 0216  Zadie Rhineonald Wickline, MD 11/12/15 [id number]

## 2015-11-12 NOTE — ED Notes (Signed)
Pt departed in NAD, refused use of wheelchair.  

## 2015-11-12 NOTE — Discharge Instructions (Signed)
Dental Abscess °A dental abscess is a collection of pus in or around a tooth. °CAUSES °This condition is caused by a bacterial infection around the root of the tooth that involves the inner part of the tooth (pulp). It may result from: °· Severe tooth decay. °· Trauma to the tooth that allows bacteria to enter into the pulp, such as a broken or chipped tooth. °· Severe gum disease around a tooth. °SYMPTOMS °Symptoms of this condition include: °· Severe pain in and around the infected tooth. °· Swelling and redness around the infected tooth, in the mouth, or in the face. °· Tenderness. °· Pus drainage. °· Bad breath. °· Bitter taste in the mouth. °· Difficulty swallowing. °· Difficulty opening the mouth. °· Nausea. °· Vomiting. °· Chills. °· Swollen neck glands. °· Fever. °DIAGNOSIS °This condition is diagnosed with examination of the infected tooth. During the exam, your dentist may tap on the infected tooth. Your dentist will also ask about your medical and dental history and may order X-rays. °TREATMENT °This condition is treated by eliminating the infection. This may be done with: °· Antibiotic medicine. °· A root canal. This may be performed to save the tooth. °· Pulling (extracting) the tooth. This may also involve draining the abscess. This is done if the tooth cannot be saved. °HOME CARE INSTRUCTIONS °· Take medicines only as directed by your dentist. °· If you were prescribed antibiotic medicine, finish all of it even if you start to feel better. °· Rinse your mouth (gargle) often with salt water to relieve pain or swelling. °· Do not drive or operate heavy machinery while taking pain medicine. °· Do not apply heat to the outside of your mouth. °· Keep all follow-up visits as directed by your dentist. This is important. °SEEK MEDICAL CARE IF: °· Your pain is worse and is not helped by medicine. °SEEK IMMEDIATE MEDICAL CARE IF: °· You have a fever or chills. °· Your symptoms suddenly get worse. °· You have a  very bad headache. °· You have problems breathing or swallowing. °· You have trouble opening your mouth. °· You have swelling in your neck or around your eye. °  °This information is not intended to replace advice given to you by your health care provider. Make sure you discuss any questions you have with your health care provider. °  °Document Released: 04/10/2005 Document Revised: 08/25/2014 Document Reviewed: 04/07/2014 °Elsevier Interactive Patient Education ©2016 Elsevier Inc. °Community Resource Guide Dental °The United Way’s “211” is a great source of information about community services available.  Access by dialing 2-1-1 from anywhere in [place], or by website -  www.nc211.org.  ° °Other Local Resources (Updated 04/2015) ° °Dental  Care °  °Services ° °  °Phone Number and Address  °Cost  °[facility] For children 0 - 21 years of age:  °• Cleaning °• Tooth brushing/flossing instruction °• Sealants, fillings, crowns °• Extractions °• Emergency treatment  [phone] °[address] °, [place] [address] Charges based on family income.  Medicaid and some insurance plans accepted.   °  °[facility] • Cleaning °• Sealants, fillings, crowns °• Extractions °• Emergency treatment [phone] °[address] °[place], [place] ° Pregnant women 18 years of age or older with a Medicaid card  °[facility] [place] • Cleaning °• Sealants, fillings, crowns °• Extractions °• Emergency treatment [phone] °[address] °[place], [place] Pregnant   women 18 years of age or older with a Medicaid card  °[facility] For children 0 - 21 years of age:  °• Cleaning °• Tooth brushing/flossing instruction °• Sealants, fillings, crowns °• Extractions °• Emergency treatment °Limited orthodontic services for patients with Medicaid [phone] °[address] °[place], [place] [address] Medicaid and [place] Health Choice cover for children up to age 21 and pregnant women.  Parents of children up to age 21 without Medicaid pay a reduced fee at time of service.  °[facility] [place] For children 0 - 21 years of age:  °• Cleaning °• Tooth brushing/flossing instruction °• Sealants, fillings, crowns °• Extractions °• Emergency treatment °Limited orthodontic services for patients with Medicaid [phone] °[address] °[place], [place].  Medicaid and [place] Health Choice cover for children up to age 21 and pregnant women.  Parents of children up to age 21 without Medicaid pay a reduced fee.  °[facility] • Cleaning °• Sealants, fillings, crowns °• Extractions ° °Hours: Tuesdays and Thursdays, 4:15 - 8 pm [phone] °[address] °[place], [place] [address] Services free of charge to [place] residents ages 18-64 who do not have health insurance, Medicare, Medicaid, or VA benefits and fall within federal poverty guidelines  °[facility] ° ° ° Provides dental care in addition to primary medical care, nutritional counseling, and pharmacy: °• Cleaning °• Sealants, fillings, crowns °• Extractions ° ° ° ° ° ° ° ° ° ° ° ° ° ° ° ° ° [phone] °[facility], [address] °[place], [place] ° °[phone] °[facility], [address] [place], [place] ° °[phone] °[facility] °[place], [place] ° °[phone] °[facility], [address] °[place], [place] ° °[phone] °[facility] °[address] °[place], [place] Accepts Medicaid, Medicare, most insurance.  Also provides services available to all with fees adjusted based on ability to pay.    °[facility] • Cleaning °• Tooth brushing/flossing  instruction °• Sealants, fillings, crowns °• Extractions °• Emergency treatment °Hours: Tuesdays, Thursdays, and Fridays from 8 am to 5 pm by appointment only. [phone] °[address] °[place], [place] [address] [place] residents with Medicaid (depending on eligibility) and children with [place] Health Choice - call for more information.  °[facility] • Extractions only ° °Hours: 2nd and 4th Thursday of each month from 6:30 am - 9 am.   [phone] ext. [phone] °[address] °[place], [place] [address] Ages 18 and older only.  Patients are seen on a first come, first served basis.  °[facility] • Cleanings °• Fillings °• Extractions °• Orthodontics °• Endodontics °• Implants/Crowns/Bridges °• Complete and partial dentures [phone] °[place],  Patients must complete an application for services.  There is often a waiting list.   ° °

## 2016-07-05 DIAGNOSIS — M549 Dorsalgia, unspecified: Secondary | ICD-10-CM | POA: Diagnosis not present

## 2016-07-05 DIAGNOSIS — Z886 Allergy status to analgesic agent status: Secondary | ICD-10-CM | POA: Diagnosis not present

## 2016-07-05 DIAGNOSIS — M545 Low back pain: Secondary | ICD-10-CM | POA: Diagnosis not present

## 2016-07-05 DIAGNOSIS — F17210 Nicotine dependence, cigarettes, uncomplicated: Secondary | ICD-10-CM | POA: Diagnosis not present

## 2016-07-05 DIAGNOSIS — G8929 Other chronic pain: Secondary | ICD-10-CM | POA: Diagnosis not present

## 2016-07-12 DIAGNOSIS — M545 Low back pain: Secondary | ICD-10-CM | POA: Diagnosis not present

## 2016-08-08 DIAGNOSIS — M7022 Olecranon bursitis, left elbow: Secondary | ICD-10-CM | POA: Diagnosis not present

## 2016-08-08 DIAGNOSIS — N411 Chronic prostatitis: Secondary | ICD-10-CM | POA: Diagnosis not present

## 2016-08-08 DIAGNOSIS — F17210 Nicotine dependence, cigarettes, uncomplicated: Secondary | ICD-10-CM | POA: Diagnosis not present

## 2016-08-08 DIAGNOSIS — L259 Unspecified contact dermatitis, unspecified cause: Secondary | ICD-10-CM | POA: Diagnosis not present

## 2016-08-08 DIAGNOSIS — N529 Male erectile dysfunction, unspecified: Secondary | ICD-10-CM | POA: Diagnosis not present

## 2016-08-08 DIAGNOSIS — M545 Low back pain: Secondary | ICD-10-CM | POA: Diagnosis not present

## 2016-08-15 DIAGNOSIS — M545 Low back pain: Secondary | ICD-10-CM | POA: Diagnosis not present

## 2016-08-25 DIAGNOSIS — N401 Enlarged prostate with lower urinary tract symptoms: Secondary | ICD-10-CM | POA: Diagnosis not present

## 2016-08-25 DIAGNOSIS — R350 Frequency of micturition: Secondary | ICD-10-CM | POA: Diagnosis not present

## 2016-08-25 DIAGNOSIS — Z9889 Other specified postprocedural states: Secondary | ICD-10-CM | POA: Diagnosis not present

## 2016-08-25 DIAGNOSIS — R030 Elevated blood-pressure reading, without diagnosis of hypertension: Secondary | ICD-10-CM | POA: Diagnosis not present

## 2016-08-25 DIAGNOSIS — L237 Allergic contact dermatitis due to plants, except food: Secondary | ICD-10-CM | POA: Diagnosis not present

## 2017-07-27 IMAGING — CR DG CHEST 2V
2 series · 2 of 2 positions shown · non-contrast
Comparison: None.

CLINICAL DATA: Pre-op for lumbar spine surgery. History of smoking.
Initial encounter.

EXAM:
CHEST  2 VIEW

[w chest pa]
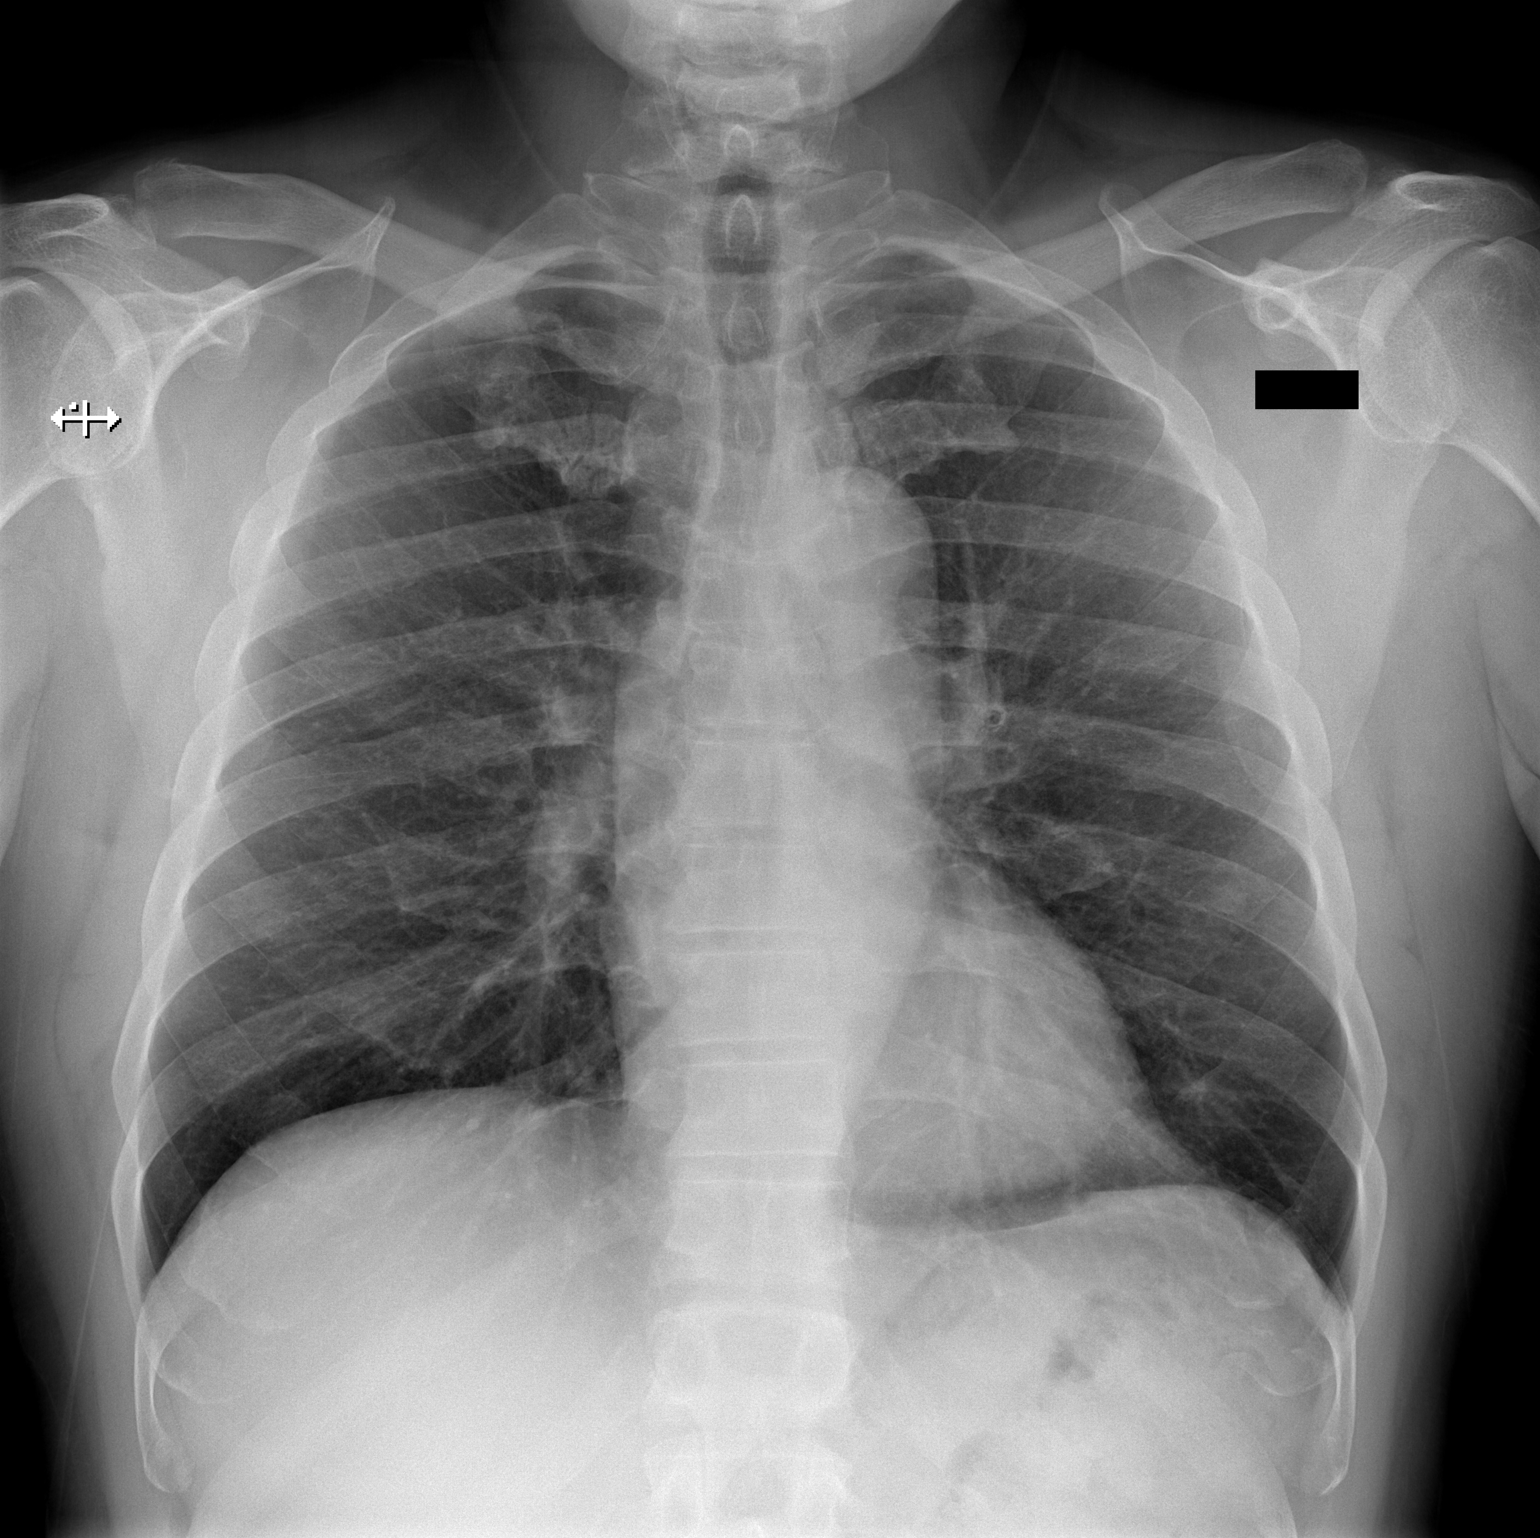

[w chest lat]
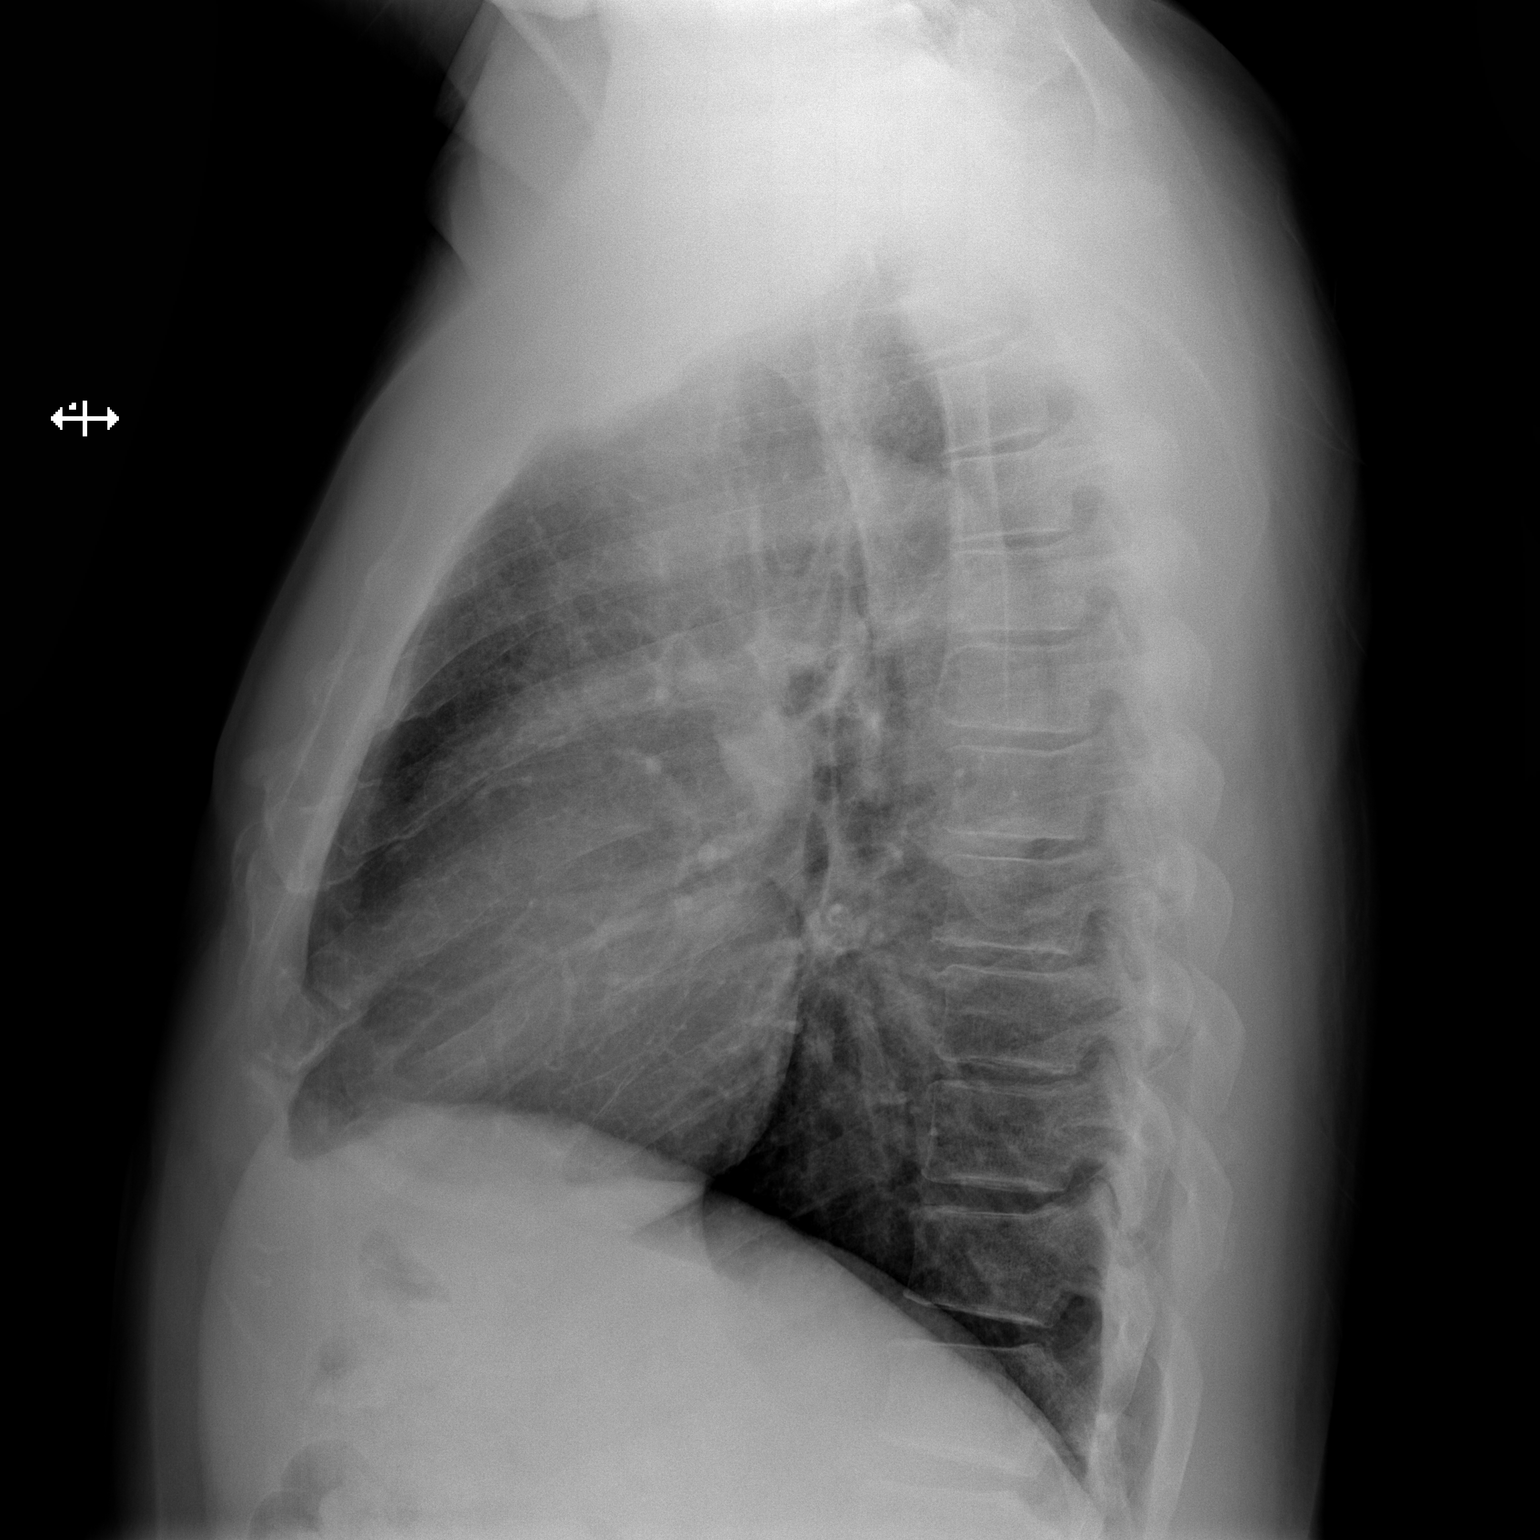

[2 of 2 positions shown; findings below may reference images not displayed]

FINDINGS: The heart size and mediastinal contours are normal. The lungs are
clear. There is no pleural effusion or pneumothorax. No acute
osseous findings are identified. Mildly prominent symmetric nipple
shadows noted bilaterally.
IMPRESSION: No active cardiopulmonary process.

## 2021-07-05 ENCOUNTER — Ambulatory Visit (HOSPITAL_COMMUNITY)
Admission: EM | Admit: 2021-07-05 | Discharge: 2021-07-05 | Disposition: A | Discharge status: Home / Self Care | Payer: Medicare Other | Attending: Family Medicine | Admitting: Family Medicine

## 2021-07-05 ENCOUNTER — Other Ambulatory Visit: Payer: Self-pay

## 2021-07-05 ENCOUNTER — Encounter (HOSPITAL_COMMUNITY): Payer: Self-pay | Admitting: *Deleted

## 2021-07-05 DIAGNOSIS — M940 Chondrocostal junction syndrome [Tietze]: Secondary | ICD-10-CM | POA: Diagnosis not present

## 2021-07-05 DIAGNOSIS — I10 Essential (primary) hypertension: Secondary | ICD-10-CM | POA: Diagnosis not present

## 2021-07-05 DIAGNOSIS — K219 Gastro-esophageal reflux disease without esophagitis: Secondary | ICD-10-CM | POA: Diagnosis not present

## 2021-07-05 MED ORDER — OMEPRAZOLE 20 MG PO CPDR: 20.0000 mg | DELAYED_RELEASE_CAPSULE | Freq: Every day | ORAL | 0 refills | Status: AC

## 2021-07-05 MED ORDER — PREDNISONE 20 MG PO TABS: 40.0000 mg | ORAL_TABLET | Freq: Every day | ORAL | 0 refills | Status: AC

## 2021-07-05 NOTE — ED Triage Notes (Signed)
Pt reports for 3 days he has had Muscle pain at ribs on the rt side of chest. Pt also reports burping frequently. Pt reports he eats but the food comes back up.  Pt also reports he has lost 3 lbs. ?

## 2021-07-05 NOTE — Discharge Instructions (Addendum)
I think your chest pain is something called costochondritis or chest wall pain and inflammation. ? ?Take prednisone 20 mg, 2 tabs daily for 5 days. ? ?You can also take Tylenol 325 or 500 mg--2 tabs every 4 hours as needed for pain. ? ?A heating pad on the sore area may also help ? ?For the indigestion symptoms, begin omeprazole 20 mg--1 capsule daily for reflux ? ?Avoid eating within 2 or 3 hours of lying down. ? ? ?

## 2021-07-05 NOTE — ED Provider Notes (Signed)
?[facility] ? ? ? ?CSN: [id number] ?Arrival date & time: 07/05/21  0930 ? ? ?  ? ?History   ?Chief Complaint ?Chief Complaint  ?Patient presents with  ? Muscle Pain  ? burping  ? ? ?HPI ?Craig Coleman is a 66 y.o. male.  ? ? ?Muscle Pain ? ?Here for sharp right anterior chest pain that began 2 days ago.  It has been hurting more when he breathes deeply.  He got a little better yesterday and then was worse again this morning. ? ?He is also had a lot of burping in the last few days, and this does not relieve the chest pain.  Occasionally has had water come back up after drinking it but not usually.  If he eats something and lies down then he will have food come up in his mouth.  He did try one of his family members pills for reflux, and it helped a lot. ? ? ?No fever or chills ? ?Past medical history significant for hypertension and he takes amlodipine 5 mg daily. Medication added to his med list in Epic ? ?Past Medical History:  ?Diagnosis Date  ? Arthritis   ? BPH (benign prostatic hyperplasia)   ? Chronic back pain greater than 3 months duration   ? Complication of anesthesia   ? pt. states had a difficult time waking up after colonoscopy  ? Frequent urination at night   ? GERD (gastroesophageal reflux disease)   ? Insomnia   ? Joint pain   ? ? ?Patient Active Problem List  ? Diagnosis Date Noted  ? Essential hypertension, benign 07/05/2021  ? Spondylolisthesis at L4-L5 level 01/27/2015  ? ? ?Past Surgical History:  ?Procedure Laterality Date  ? BACK SURGERY    ? COLONOSCOPY    ? HEMORRHOID SURGERY    ? TONSILLECTOMY    ? ? ? ? ? ?Home Medications   ? ?Prior to Admission medications   ?Medication Sig Start Date End Date Taking? Authorizing Provider  ?amLODipine (NORVASC) 5 MG tablet Take 5 mg by mouth daily.   Yes [provider]  ?omeprazole (PRILOSEC) 20 MG capsule Take 1 capsule (20 mg total) by mouth daily. 07/05/21  Yes Zenia ResidesBanister, Neveen Daponte K, MD  ?predniSONE (DELTASONE) 20 MG tablet Take 2  tablets (40 mg total) by mouth daily with breakfast for 5 days. 07/05/21 07/10/21 Yes Zenia ResidesBanister, Taylin Mans K, MD  ? ? ?Family History ?History reviewed. No pertinent family history. ? ?Social History ?Social History  ? ?Tobacco Use  ? Smoking status: Every Day  ?  Packs/day: 0.00  ?  Types: Cigarettes  ?Substance Use Topics  ? Alcohol use: Yes  ? Drug use: No  ? ? ? ?Allergies   ?Ezetimibe, Fenofibrate, Azithromycin, Doxycycline, Ibuprofen, and Tramadol ? ? ?Review of Systems ?Review of Systems ? ? ?Physical Exam ?Triage Vital Signs ?ED Triage Vitals  ?Enc Vitals Group  ?   BP 07/05/21 1004 (!) 160/94  ?   Pulse Rate 07/05/21 1004 75  ?   Resp 07/05/21 1004 20  ?   Temp 07/05/21 1004 98.4 ?F (36.9 ?C)  ?   Temp src --   ?   SpO2 07/05/21 1004 98 %  ?   Weight --   ?   Height --   ?   Head Circumference --   ?   Peak Flow --   ?   Pain Score 07/05/21 0958 8  ?   Pain Loc --   ?  Pain Edu? --   ?   Excl. in GC? --   ? ?No data found. ? ?Updated Vital Signs ?BP (!) 160/94   Pulse 75   Temp 98.4 ?F (36.9 ?C)   Resp 20   SpO2 98%  ? ?Visual Acuity ?Right Eye Distance:   ?Left Eye Distance:   ?Bilateral Distance:   ? ?Right Eye Near:   ?Left Eye Near:    ?Bilateral Near:    ? ?Physical Exam ?Vitals reviewed.  ?Constitutional:   ?   General: He is not in acute distress. ?   Appearance: He is not toxic-appearing.  ?HENT:  ?   Mouth/Throat:  ?   Mouth: Mucous membranes are moist.  ?   Pharynx: No oropharyngeal exudate or posterior oropharyngeal erythema.  ?Eyes:  ?   Extraocular Movements: Extraocular movements intact.  ?   Conjunctiva/sclera: Conjunctivae normal.  ?   Pupils: Pupils are equal, round, and reactive to light.  ?Cardiovascular:  ?   Rate and Rhythm: Normal rate and regular rhythm.  ?   Heart sounds: No murmur heard. ?Pulmonary:  ?   Effort: Pulmonary effort is normal.  ?   Breath sounds: Normal breath sounds. No stridor. No wheezing or rhonchi.  ?Chest:  ?   Chest wall: Tenderness (very tender to palpation of  the right sternal border) present.  ?Musculoskeletal:  ?   Cervical back: Neck supple.  ?Lymphadenopathy:  ?   Cervical: No cervical adenopathy.  ?Skin: ?   Coloration: Skin is not jaundiced or pale.  ?   Findings: No rash.  ?Neurological:  ?   General: No focal deficit present.  ?   Mental Status: He is alert and oriented to person, place, and time.  ?Psychiatric:     ?   Behavior: Behavior normal.  ? ? ? ?UC Treatments / Results  ?Labs ?(all labs ordered are listed, but only abnormal results are displayed) ?Labs Reviewed - No data to display ? ?EKG ? ? ?Radiology ?No results found. ? ?Procedures ?Procedures (including critical care time) ? ?Medications Ordered in UC ?Medications - No data to display ? ?Initial Impression / Assessment and Plan / UC Course  ?I have reviewed the triage vital signs and the nursing notes. ? ?Pertinent labs & imaging results that were available during my care of the patient were reviewed by me and considered in my medical decision making (see chart for details). ? ?  ? ?Treat for costochondritis.  Also will begin him on a PPI for the reflux symptoms.  He is already started some famotidine which did not completely relieve the symptoms.  He does have a primary care office in [place] where he lives most the time ?Final Clinical Impressions(s) / UC Diagnoses  ? ?Final diagnoses:  ?Costochondritis  ?Gastroesophageal reflux disease without esophagitis  ?Essential hypertension, benign  ? ? ? ?Discharge Instructions   ? ?  ?I think your chest pain is something called costochondritis or chest wall pain and inflammation. ? ?Take prednisone 20 mg, 2 tabs daily for 5 days. ? ?You can also take Tylenol 325 or 500 mg--2 tabs every 4 hours as needed for pain. ? ?A heating pad on the sore area may also help ? ?For the indigestion symptoms, begin omeprazole 20 mg--1 capsule daily for reflux ? ?Avoid eating within 2 or 3 hours of lying down. ? ? ? ? ? ? ?ED Prescriptions   ? ? Medication Sig Dispense  Auth. Provider  ? predniSONE (DELTASONE)  20 MG tablet Take 2 tablets (40 mg total) by mouth daily with breakfast for 5 days. 10 tablet Zenia Resides, MD  ? omeprazole (PRILOSEC) 20 MG capsule Take 1 capsule (20 mg total) by mouth daily. 30 capsule Zenia Resides, MD  ? ?  ? ?I have reviewed the PDMP during this encounter. ?  ?Zenia Resides, MD ?07/05/21 1113 ? ?

## 2021-07-19 ENCOUNTER — Other Ambulatory Visit: Payer: Self-pay | Admitting: Internal Medicine

## 2021-07-20 LAB — LIPID PANEL
Cholesterol: 200 mg/dL — ABNORMAL HIGH (ref <200)
HDL: 78 mg/dL (ref >=40)
LDL Cholesterol (Calc): 106 mg/dL (calc) — ABNORMAL HIGH
Non-HDL Cholesterol (Calc): 122 mg/dL (calc) (ref <130)
Total CHOL/HDL Ratio: 2.6 (calc) (ref <5.0)
Triglycerides: 73 mg/dL (ref <150)

## 2021-07-20 LAB — CBC
HCT: 41.8 % (ref 38.5–50.0)
Hemoglobin: 13.7 g/dL (ref 13.2–17.1)
MCH: 28.6 pg (ref 27.0–33.0)
MCHC: 32.8 g/dL (ref 32.0–36.0)
MCV: 87.3 fL (ref 80.0–100.0)
MPV: 10.4 fL (ref 7.5–12.5)
Platelets: 204 10*3/uL (ref 140–400)
RBC: 4.79 10*6/uL (ref 4.20–5.80)
RDW: 14.3 % (ref 11.0–15.0)
WBC: 12.2 10*3/uL — ABNORMAL HIGH (ref 3.8–10.8)

## 2021-07-20 LAB — COMPLETE METABOLIC PANEL WITH GFR
AG Ratio: 1.5 (calc) (ref 1.0–2.5)
ALT: 15 U/L (ref 9–46)
AST: 22 U/L (ref 10–35)
Albumin: 4.5 g/dL (ref 3.6–5.1)
Alkaline phosphatase (APISO): 43 U/L (ref 35–144)
BUN: 16 mg/dL (ref 7–25)
CO2: 21 mmol/L (ref 20–32)
Calcium: 9.6 mg/dL (ref 8.6–10.3)
Chloride: 107 mmol/L (ref 98–110)
Creat: 1.17 mg/dL (ref 0.70–1.35)
Globulin: 3.1 g/dL (calc) (ref 1.9–3.7)
Glucose, Bld: 71 mg/dL (ref 65–99)
Potassium: 3.9 mmol/L (ref 3.5–5.3)
Sodium: 141 mmol/L (ref 135–146)
Total Bilirubin: 0.5 mg/dL (ref 0.2–1.2)
Total Protein: 7.6 g/dL (ref 6.1–8.1)
eGFR: 70 mL/min/{1.73_m2} (ref >=60)

## 2021-07-20 LAB — VITAMIN D 25 HYDROXY (VIT D DEFICIENCY, FRACTURES): Vit D, 25-Hydroxy: 14 ng/mL — ABNORMAL LOW (ref 30–100)

## 2021-07-20 LAB — TSH: TSH: 0.93 mIU/L (ref 0.40–4.50)

## 2021-07-20 LAB — PSA: PSA: 1.71 ng/mL (ref <=4.00)

## 2021-12-02 ENCOUNTER — Encounter (HOSPITAL_COMMUNITY): Payer: Self-pay | Admitting: Emergency Medicine

## 2021-12-02 ENCOUNTER — Emergency Department (HOSPITAL_COMMUNITY): Payer: Medicare Other

## 2021-12-02 ENCOUNTER — Emergency Department (HOSPITAL_COMMUNITY)
Admission: EM | Admit: 2021-12-02 | Discharge: 2021-12-03 | Disposition: A | Discharge status: Home / Self Care | Payer: Medicare Other | Attending: Emergency Medicine | Admitting: Emergency Medicine

## 2021-12-02 ENCOUNTER — Other Ambulatory Visit: Payer: Self-pay

## 2021-12-02 DIAGNOSIS — N179 Acute kidney failure, unspecified: Secondary | ICD-10-CM | POA: Diagnosis not present

## 2021-12-02 DIAGNOSIS — R0781 Pleurodynia: Secondary | ICD-10-CM | POA: Insufficient documentation

## 2021-12-02 DIAGNOSIS — R059 Cough, unspecified: Secondary | ICD-10-CM | POA: Diagnosis not present

## 2021-12-02 DIAGNOSIS — I10 Essential (primary) hypertension: Secondary | ICD-10-CM | POA: Insufficient documentation

## 2021-12-02 DIAGNOSIS — R0789 Other chest pain: Secondary | ICD-10-CM

## 2021-12-02 DIAGNOSIS — Z20822 Contact with and (suspected) exposure to covid-19: Secondary | ICD-10-CM | POA: Insufficient documentation

## 2021-12-02 DIAGNOSIS — Z79899 Other long term (current) drug therapy: Secondary | ICD-10-CM | POA: Insufficient documentation

## 2021-12-02 HISTORY — DX: Essential (primary) hypertension: I10

## 2021-12-02 LAB — CBC
HCT: 38.6 % — ABNORMAL LOW (ref 39.0–52.0)
Hemoglobin: 13.2 g/dL (ref 13.0–17.0)
MCH: 28.6 pg (ref 26.0–34.0)
MCHC: 34.2 g/dL (ref 30.0–36.0)
MCV: 83.7 fL (ref 80.0–100.0)
Platelets: 268 10*3/uL (ref 150–400)
RBC: 4.61 MIL/uL (ref 4.22–5.81)
RDW: 15 % (ref 11.5–15.5)
WBC: 10.9 10*3/uL — ABNORMAL HIGH (ref 4.0–10.5)
nRBC: 0 % (ref 0.0–0.2)

## 2021-12-02 LAB — TROPONIN I (HIGH SENSITIVITY): Troponin I (High Sensitivity): 4 ng/L (ref <18)

## 2021-12-02 LAB — BASIC METABOLIC PANEL
Anion gap: 9 (ref 5–15)
BUN: 11 mg/dL (ref 8–23)
CO2: 23 mmol/L (ref 22–32)
Calcium: 9.6 mg/dL (ref 8.9–10.3)
Chloride: 108 mmol/L (ref 98–111)
Creatinine, Ser: 1.4 mg/dL — ABNORMAL HIGH (ref 0.61–1.24)
GFR, Estimated: 56 mL/min — ABNORMAL LOW (ref >60)
Glucose, Bld: 96 mg/dL (ref 70–99)
Potassium: 3.7 mmol/L (ref 3.5–5.1)
Sodium: 140 mmol/L (ref 135–145)

## 2021-12-02 NOTE — ED Triage Notes (Signed)
Patient reports mid/left chest pain with palpitations for 3 days and SOB/lightheaded . No emesis or diaphoresis .

## 2021-12-02 NOTE — ED Provider Triage Note (Signed)
Emergency Medicine Provider Triage Evaluation Note  Craig Coleman , a 66 y.o. male  was evaluated in triage.  Pt complains of chest pain for the last 3 days.  Patient reports for the first 2 days the chest pain was intermittent, the last 1 day the chest pain is been constant.  The patient describes the chest pain as stabbing, pressure.  Patient states the chest pain radiates to his left arm.  Patient also endorsing shortness of breath.  The patient denies history of cardiac disease.  Patient denies nausea, vomiting, lightheadedness dizziness or weakness.  Review of Systems  Positive:  Negative:   Physical Exam  BP (!) 142/88 (BP Location: Right Arm)   Pulse 73   Temp 98.6 F (37 C) (Oral)   Resp 20   SpO2 100%  Gen:   Awake, no distress   Resp:  Normal effort  MSK:   Moves extremities without difficulty  Other:    Medical Decision Making  Medically screening exam initiated at 9:52 PM.  Appropriate orders placed.  Jesse Nosbisch was informed that the remainder of the evaluation will be completed by another provider, this initial triage assessment does not replace that evaluation, and the importance of remaining in the ED until their evaluation is complete.     Al Decant, PA-C 12/02/21 2153

## 2021-12-03 LAB — D-DIMER, QUANTITATIVE: D-Dimer, Quant: 0.51 ug/mL-FEU — ABNORMAL HIGH (ref 0.00–0.50)

## 2021-12-03 LAB — RESP PANEL BY RT-PCR (FLU A&B, COVID) ARPGX2
Influenza A by PCR: NEGATIVE
Influenza B by PCR: NEGATIVE
SARS Coronavirus 2 by RT PCR: NEGATIVE

## 2021-12-03 LAB — TROPONIN I (HIGH SENSITIVITY)
Troponin I (High Sensitivity): 4 ng/L (ref <18)
Troponin I (High Sensitivity): 5 ng/L (ref <18)

## 2021-12-03 MED ORDER — LIDOCAINE 5 % EX PTCH
1.0000 | MEDICATED_PATCH | CUTANEOUS | Status: DC
  Administered 2021-12-03: 1 via TRANSDERMAL
  Filled 2021-12-03: qty 1

## 2021-12-03 MED ORDER — LACTATED RINGERS IV BOLUS
1000.0000 mL | Freq: Once | INTRAVENOUS | Status: AC
  Administered 2021-12-03: 1000 mL via INTRAVENOUS

## 2021-12-03 MED ORDER — ACETAMINOPHEN 500 MG PO TABS
1000.0000 mg | ORAL_TABLET | ORAL | Status: AC
Start: 2021-12-03 — End: 2021-12-03
  Administered 2021-12-03: 1000 mg via ORAL
  Filled 2021-12-03: qty 2

## 2021-12-03 NOTE — ED Provider Notes (Signed)
[facility] EMERGENCY DEPARTMENT Provider Note   CSN: [id number] Arrival date & time: 12/02/21  2125     History  Chief Complaint  Patient presents with   Chest Pain    Craig Coleman is a 66 y.o. male.  66 yo M with hx of GERD, costocondritis, tobacco use, and HTN who presents with chest pain. L sided pressure. Worse with nonproductive cough that has been present x2 days. Also worse with laying on L side and with taking deep breaths. Unsure if exertional. No fevers or chills. Does have runny nose. No vomiting, diaphoresis, or radiation of pain. No LE swelling or pain. No hx of cancer or DVT. No immediate family members with MI.   Chest Pain  Past Medical History:  Diagnosis Date   Arthritis    BPH (benign prostatic hyperplasia)    Chronic back pain greater than 3 months duration    Complication of anesthesia    pt. states had a difficult time waking up after colonoscopy   Frequent urination at night    GERD (gastroesophageal reflux disease)    Hypertension    Insomnia    Joint pain        Home Medications Prior to Admission medications   Medication Sig Start Date End Date Taking? Authorizing Provider  amLODipine (NORVASC) 5 MG tablet Take 5 mg by mouth daily.    [provider]  omeprazole (PRILOSEC) 20 MG capsule Take 1 capsule (20 mg total) by mouth daily. 07/05/21   Zenia Resides, MD      Allergies    Ezetimibe, Fenofibrate, Azithromycin, Doxycycline, Ibuprofen, and Tramadol    Review of Systems   Review of Systems  Cardiovascular:  Positive for chest pain.    Physical Exam Updated Vital Signs BP 132/80   Pulse (!) 54   Temp 98.4 F (36.9 C) (Oral)   Resp 16   SpO2 98%  Physical Exam Vitals and nursing note reviewed.  Constitutional:      General: He is not in acute distress.    Appearance: He is well-developed.  HENT:     Head: Normocephalic and atraumatic.     Right Ear: External ear normal.     Left Ear:  External ear normal.     Nose: Nose normal.  Eyes:     Extraocular Movements: Extraocular movements intact.     Conjunctiva/sclera: Conjunctivae normal.     Pupils: Pupils are equal, round, and reactive to light.  Cardiovascular:     Rate and Rhythm: Normal rate and regular rhythm.     Heart sounds: Normal heart sounds.  Pulmonary:     Effort: Pulmonary effort is normal. No respiratory distress.     Breath sounds: Normal breath sounds.  Abdominal:     General: There is no distension.     Palpations: Abdomen is soft. There is no mass.     Tenderness: There is no abdominal tenderness. There is no guarding.  Musculoskeletal:        General: No swelling.     Cervical back: Normal range of motion and neck supple.     Right lower leg: No edema.     Left lower leg: No edema.     Comments: Radial pulses 2+ BL  Skin:    General: Skin is warm and dry.     Capillary Refill: Capillary refill takes less than 2 seconds.  Neurological:     Mental Status: He is alert. Mental status is at baseline.  Psychiatric:        Mood and Affect: Mood normal.        Behavior: Behavior normal.     ED Results / Procedures / Treatments   Labs (all labs ordered are listed, but only abnormal results are displayed) Labs Reviewed  BASIC METABOLIC PANEL - Abnormal; Notable for the following components:      Result Value   Creatinine, Ser 1.40 (*)    GFR, Estimated 56 (*)    All other components within normal limits  CBC - Abnormal; Notable for the following components:   WBC 10.9 (*)    HCT 38.6 (*)    All other components within normal limits  D-DIMER, QUANTITATIVE - Abnormal; Notable for the following components:   D-Dimer, Quant 0.51 (*)    All other components within normal limits  RESP PANEL BY RT-PCR (FLU A&B, COVID) ARPGX2  TROPONIN I (HIGH SENSITIVITY)  TROPONIN I (HIGH SENSITIVITY)  TROPONIN I (HIGH SENSITIVITY)    EKG EKG Interpretation  Date/Time:  Friday December 02 2021 21:38:18  EDT Ventricular Rate:  73 PR Interval:  148 QRS Duration: 86 QT Interval:  376 QTC Calculation: 414 R Axis:   69 Text Interpretation: Normal sinus rhythm with sinus arrhythmia Normal ECG When compared with ECG of 05-Jul-2021 10:13, PREVIOUS ECG IS PRESENT Confirmed by Vonita Moss [phone]) on 12/03/2021 11:45:45 AM  Radiology DG Chest 1 View  Result Date: 12/02/2021 CLINICAL DATA:  Chest pain days EXAM: CHEST  1 VIEW COMPARISON:  01/19/2015 FINDINGS: The heart size and mediastinal contours are within normal limits. Both lungs are clear. The visualized skeletal structures are unremarkable. Nipple shadows are noted bilaterally and stable. IMPRESSION: No acute abnormality noted. Electronically Signed   By: Alcide Clever M.D.   On: 12/02/2021 22:14    Procedures Procedures   Medications Ordered in ED Medications  lidocaine (LIDODERM) 5 % 1 patch (1 patch Transdermal Patch Applied 12/03/21 1254)  acetaminophen (TYLENOL) tablet 1,000 mg (1,000 mg Oral Given 12/03/21 1255)  lactated ringers bolus 1,000 mL (0 mLs Intravenous Stopped 12/03/21 1418)    ED Course/ Medical Decision Making/ A&P Clinical Course as of 12/03/21 1607  Sat Dec 03, 2021  1312 D-Dimer, Quant(!): 0.51 [RP]  1312 Within age-adjusted limits so CTA not indicated. [RP]    Clinical Course User Index [RP] Rondel Baton, MD                           Medical Decision Making Amount and/or Complexity of Data Reviewed Labs: ordered. Decision-making details documented in ED Course.  Risk OTC drugs. Prescription drug management.  Craig Coleman is a 66 y.o. male with history of costochondritis, GERD, tobacco use, and hypertension who presents with chief complaint of chest pain.  Initial DDx: MI, PE, pleurisy due to viral infection, costochondritis  Plan:  EKG Troponin D-dimer COVID and flu Chest x-ray  ED Summary:  Patient was monitored in the emergency department and reported improvement in his symptoms with  the Tylenol and lidocaine patch.  He had serial troponins which were 4 and then 5.  EKG did not show any concerning ischemic changes.  Chest x-ray also did not show any acute findings.  Patient was noted to have worsening of his renal function when compared to 4 months ago so he was given a bolus of lactated Ringer's.  Patient was informed of this and the fact that he will need to follow-up with a primary  doctor regarding his renal function and chest discomfort.  Return precautions discussed with the patient prior to discharge.  Records reviewed Care Everywhere/External Records  The following labs were independently interpreted: BMP consistent with acute kidney injury.  D-dimer within adjusted age limits.  I independently visualized the following imaging with scope of interpretation limited to determining acute life threatening conditions related to emergency care: Chest XR, which revealed no acute findings   Final Clinical Impression(s) / ED Diagnoses Final diagnoses:  AKI (acute kidney injury) (HCC)  Chest wall pain  Pleuritic chest pain    Rx / DC Orders ED Discharge Orders     None         Rondel Baton, MD 12/03/21 1607

## 2021-12-03 NOTE — Discharge Instructions (Signed)
Today you were seen in the emergency department for your chest pain.    In the emergency department you had a chest x-ray, EKG, and lab work including troponins that were reassuring.  You were also found to have a kidney injury that is likely from dehydration and was given fluids.  At home, please use Tylenol and lidocaine patches as needed for your chest discomfort.  Stay well-hydrated for your kidney injury.  Follow-up with your primary doctor in 2-3 days regarding your visit and for repeat lab work to ensure that your kidney injury is improving.  Return immediately to the emergency department if you experience any of the following: Increasing chest pain, shortness of breath, or any other concerning symptoms.    Thank you for visiting our Emergency Department. It was a pleasure taking care of you today.

## 2021-12-08 ENCOUNTER — Other Ambulatory Visit: Payer: Self-pay | Admitting: Internal Medicine

## 2021-12-09 LAB — BASIC METABOLIC PANEL WITH GFR
BUN: 14 mg/dL (ref 7–25)
CO2: 22 mmol/L (ref 20–32)
Calcium: 9.5 mg/dL (ref 8.6–10.3)
Chloride: 107 mmol/L (ref 98–110)
Creat: 1.21 mg/dL (ref 0.70–1.35)
Glucose, Bld: 118 mg/dL — ABNORMAL HIGH (ref 65–99)
Potassium: 3.9 mmol/L (ref 3.5–5.3)
Sodium: 142 mmol/L (ref 135–146)
eGFR: 67 mL/min/{1.73_m2} (ref >=60)

## 2021-12-09 LAB — VITAMIN D 25 HYDROXY (VIT D DEFICIENCY, FRACTURES): Vit D, 25-Hydroxy: 28 ng/mL — ABNORMAL LOW (ref 30–100)

## 2021-12-09 LAB — EXTRA LAV TOP TUBE

## 2022-05-30 ENCOUNTER — Other Ambulatory Visit: Payer: Self-pay | Admitting: Internal Medicine

## 2022-05-31 LAB — CBC
HCT: 43 % (ref 38.5–50.0)
Hemoglobin: 14.2 g/dL (ref 13.2–17.1)
MCH: 28 pg (ref 27.0–33.0)
MCHC: 33 g/dL (ref 32.0–36.0)
MCV: 84.8 fL (ref 80.0–100.0)
MPV: 10.4 fL (ref 7.5–12.5)
Platelets: 252 10*3/uL (ref 140–400)
RBC: 5.07 10*6/uL (ref 4.20–5.80)
RDW: 14.5 % (ref 11.0–15.0)
WBC: 10.5 10*3/uL (ref 3.8–10.8)

## 2022-05-31 LAB — LIPID PANEL
Cholesterol: 184 mg/dL (ref <200)
HDL: 70 mg/dL (ref >=40)
LDL Cholesterol (Calc): 94 mg/dL (calc)
Non-HDL Cholesterol (Calc): 114 mg/dL (calc) (ref <130)
Total CHOL/HDL Ratio: 2.6 (calc) (ref <5.0)
Triglycerides: 102 mg/dL (ref <150)

## 2022-05-31 LAB — COMPLETE METABOLIC PANEL WITH GFR
AG Ratio: 1.4 (calc) (ref 1.0–2.5)
ALT: 16 U/L (ref 9–46)
AST: 26 U/L (ref 10–35)
Albumin: 4.6 g/dL (ref 3.6–5.1)
Alkaline phosphatase (APISO): 44 U/L (ref 35–144)
BUN: 11 mg/dL (ref 7–25)
CO2: 25 mmol/L (ref 20–32)
Calcium: 9.7 mg/dL (ref 8.6–10.3)
Chloride: 106 mmol/L (ref 98–110)
Creat: 1.16 mg/dL (ref 0.70–1.35)
Globulin: 3.2 g/dL (calc) (ref 1.9–3.7)
Glucose, Bld: 103 mg/dL — ABNORMAL HIGH (ref 65–99)
Potassium: 4.4 mmol/L (ref 3.5–5.3)
Sodium: 143 mmol/L (ref 135–146)
Total Bilirubin: 0.6 mg/dL (ref 0.2–1.2)
Total Protein: 7.8 g/dL (ref 6.1–8.1)
eGFR: 70 mL/min/{1.73_m2} (ref >=60)

## 2022-05-31 LAB — VITAMIN D 25 HYDROXY (VIT D DEFICIENCY, FRACTURES): Vit D, 25-Hydroxy: 22 ng/mL — ABNORMAL LOW (ref 30–100)

## 2022-05-31 LAB — TSH: TSH: 2.5 mIU/L (ref 0.40–4.50)

## 2022-05-31 LAB — PSA: PSA: 3.48 ng/mL (ref <=4.00)

## 2023-03-16 ENCOUNTER — Ambulatory Visit (HOSPITAL_COMMUNITY)
Admission: EM | Admit: 2023-03-16 | Discharge: 2023-03-16 | Disposition: A | Discharge status: Home / Self Care | Payer: 59 | Attending: Emergency Medicine | Admitting: Emergency Medicine

## 2023-03-16 ENCOUNTER — Encounter (HOSPITAL_COMMUNITY): Payer: Self-pay | Admitting: *Deleted

## 2023-03-16 ENCOUNTER — Other Ambulatory Visit: Payer: Self-pay

## 2023-03-16 DIAGNOSIS — M5442 Lumbago with sciatica, left side: Secondary | ICD-10-CM | POA: Diagnosis not present

## 2023-03-16 DIAGNOSIS — N481 Balanitis: Secondary | ICD-10-CM

## 2023-03-16 MED ORDER — DEXAMETHASONE SODIUM PHOSPHATE 10 MG/ML IJ SOLN
10.0000 mg | Freq: Once | INTRAMUSCULAR | Status: AC
  Administered 2023-03-16: 10 mg via INTRAMUSCULAR

## 2023-03-16 MED ORDER — HYDROCORTISONE 1 % EX CREA: TOPICAL_CREAM | CUTANEOUS | 0 refills | Status: AC

## 2023-03-16 MED ORDER — DEXAMETHASONE SODIUM PHOSPHATE 10 MG/ML IJ SOLN
INTRAMUSCULAR | Status: AC
  Filled 2023-03-16: qty 1

## 2023-03-16 NOTE — ED Triage Notes (Signed)
Pt reports pain in Lt back and hip. Pt also skin irritation to Penis.

## 2023-03-16 NOTE — ED Provider Notes (Signed)
[facility]    CSN: [id number] Arrival date & time: 03/16/23  1520      History   Chief Complaint Chief Complaint  Patient presents with   Hip Pain   Penis Pain    HPI Craig Coleman is a 67 y.o. male.   Patient presents to clinic complaining of left-sided lower back pain, left hip pain and pain that radiates from his back down to the back of his left knee.  He has had back surgery in the past and a history of back pain.  Reports normally back pain is on both sides.  Denies any triggering factors, no recent falls or trauma.  No incontinence or inner leg numbness.  It did take a Tylenol, which helped a little bit.  He has not tried any other medications.  He did have leftover tizanidine from his back surgery, but he does not like taking oral medications so they did not draw these.  He has also been having an irritation around the head of his penis.  Reports he was circumcised a few years ago and the area has been itchy ever since then.  Denies any rash or skin changes.  Sometimes he will itch a lot and the area will swell.  Reports he has not been sexually active in the past year.  He has had multiple STI screenings in the past for this issue and they have all been negative.  No penile discharge.  No dysuria.   The history is provided by the patient and medical records.  Hip Pain  Penis Pain    Past Medical History:  Diagnosis Date   Arthritis    BPH (benign prostatic hyperplasia)    Chronic back pain greater than 3 months duration    Complication of anesthesia    pt. states had a difficult time waking up after colonoscopy   Frequent urination at night    GERD (gastroesophageal reflux disease)    Hypertension    Insomnia    Joint pain     Patient Active Problem List   Diagnosis Date Noted   Essential hypertension, benign 07/05/2021   Spondylolisthesis at L4-L5 level 01/27/2015    Past Surgical History:  Procedure Laterality Date   BACK SURGERY      COLONOSCOPY     HEMORRHOID SURGERY     TONSILLECTOMY         Home Medications    Prior to Admission medications   Medication Sig Start Date End Date Taking? Authorizing Provider  amLODipine (NORVASC) 5 MG tablet Take 5 mg by mouth daily.   Yes [provider]  hydrocortisone cream 1 % Apply to affected area 2 times daily 03/16/23  Yes Rinaldo Ratel, [place] N, FNP  omeprazole (PRILOSEC) 20 MG capsule Take 1 capsule (20 mg total) by mouth daily. 07/05/21  Yes Zenia Resides, MD    Family History History reviewed. No pertinent family history.  Social History Social History   Tobacco Use   Smoking status: Every Day    Types: Cigarettes  Substance Use Topics   Alcohol use: Yes   Drug use: No     Allergies   Ezetimibe, Fenofibrate, Azithromycin, Doxycycline, Ibuprofen, and Tramadol   Review of Systems Review of Systems  Per HPI   Physical Exam Triage Vital Signs ED Triage Vitals  Encounter Vitals Group     BP 03/16/23 1703 130/83     Systolic BP Percentile --      Diastolic BP Percentile --  Pulse Rate 03/16/23 1703 (!) 57     Resp 03/16/23 1703 18     Temp 03/16/23 1703 98.1 F (36.7 C)     Temp src --      SpO2 03/16/23 1703 98 %     Weight --      Height --      Head Circumference --      Peak Flow --      Pain Score 03/16/23 1701 8     Pain Loc --      Pain Education --      Exclude from Growth Chart --    No data found.  Updated Vital Signs BP 130/83   Pulse (!) 57   Temp 98.1 F (36.7 C)   Resp 18   SpO2 98%   Visual Acuity Right Eye Distance:   Left Eye Distance:   Bilateral Distance:    Right Eye Near:   Left Eye Near:    Bilateral Near:     Physical Exam Vitals and nursing note reviewed. Exam conducted with a chaperone present.  Constitutional:      Appearance: Normal appearance.  HENT:     Head: Normocephalic.     Right Ear: External ear normal.     Left Ear: External ear normal.     Nose: Nose normal.      Mouth/Throat:     Mouth: Mucous membranes are moist.  Eyes:     Conjunctiva/sclera: Conjunctivae normal.  Genitourinary:    Penis: Circumcised.      Comments: Dry, loose skin circumferentially around the head of the penis.  No obvious rashes, redness, erythema or streaking. Musculoskeletal:        General: Tenderness present. Normal range of motion.  Skin:    General: Skin is warm and dry.  Neurological:     General: No focal deficit present.     Mental Status: He is alert and oriented to person, place, and time.  Psychiatric:        Behavior: Behavior normal. Behavior is cooperative.      UC Treatments / Results  Labs (all labs ordered are listed, but only abnormal results are displayed) Labs Reviewed - No data to display  EKG   Radiology No results found.  Procedures Procedures (including critical care time)  Medications Ordered in UC Medications  dexamethasone (DECADRON) injection 10 mg (10 mg Intramuscular Given 03/16/23 1729)    Initial Impression / Assessment and Plan / UC Course  I have reviewed the triage vital signs and the nursing notes.  Pertinent labs & imaging results that were available during my care of the patient were reviewed by me and considered in my medical decision making (see chart for details).  Vitals and triage reviewed, patient is hemodynamically stable.  Left-sided lower back pain and lumbar muscular area is tender to palpation.  Spine without step-off or deformity.  Atraumatic.  Without red flag symptoms of cauda equina or inner leg numbness.  Symptoms consistent with sciatica, IM steroid given in clinic.  Encouraged orthopedic follow-up.  Chaperone present for penile exam.  Does have some loose skin circumferentially around the head of his penis without rash.  Will trial hydrocortisone cream.  No obvious fungal infection at this time, multiple negative STI testing in the past, no recent sexual activity.  Plan of care, follow-up care return  precautions given, no questions at this time.     Final Clinical Impressions(s) / UC Diagnoses   Final diagnoses:  Acute left-sided low back pain with left-sided sciatica  Balanitis     Discharge Instructions      We have given you a steroid injection to help with the low back pain radiating into your left leg, most likely sciatica.  Please do the attached stretches and see if this helps.  If your pain persists or returns please follow-up with an orthopedic for further evaluation.  The skin on your penis do not appear infected.  You can use the topical hydrocortisone cream to the area 2 times daily after bathing with a hypoallergenic soap.  Please pat the area dry and apply the cream if it starts to itch.  Return to clinic for any new or urgent symptoms.      ED Prescriptions     Medication Sig Dispense Auth. Provider   hydrocortisone cream 1 % Apply to affected area 2 times daily 28 g Starr Urias, [place] N, [place]      PDMP not reviewed this encounter.   Reeve Mallo, [place] N, [place] 03/16/23 [phone]

## 2023-03-16 NOTE — Discharge Instructions (Signed)
We have given you a steroid injection to help with the low back pain radiating into your left leg, most likely sciatica.  Please do the attached stretches and see if this helps.  If your pain persists or returns please follow-up with an orthopedic for further evaluation.  The skin on your penis do not appear infected.  You can use the topical hydrocortisone cream to the area 2 times daily after bathing with a hypoallergenic soap.  Please pat the area dry and apply the cream if it starts to itch.  Return to clinic for any new or urgent symptoms.

## 2023-04-26 ENCOUNTER — Emergency Department (HOSPITAL_COMMUNITY)
Admission: EM | Admit: 2023-04-26 | Discharge: 2023-04-27 | Disposition: A | Discharge status: Home / Self Care | Payer: 59 | Attending: Emergency Medicine | Admitting: Emergency Medicine

## 2023-04-26 ENCOUNTER — Emergency Department (HOSPITAL_COMMUNITY): Payer: 59

## 2023-04-26 ENCOUNTER — Other Ambulatory Visit: Payer: Self-pay

## 2023-04-26 ENCOUNTER — Encounter (HOSPITAL_COMMUNITY): Payer: Self-pay | Admitting: Emergency Medicine

## 2023-04-26 DIAGNOSIS — R110 Nausea: Secondary | ICD-10-CM | POA: Insufficient documentation

## 2023-04-26 DIAGNOSIS — I700 Atherosclerosis of aorta: Secondary | ICD-10-CM | POA: Insufficient documentation

## 2023-04-26 DIAGNOSIS — Z20822 Contact with and (suspected) exposure to covid-19: Secondary | ICD-10-CM | POA: Insufficient documentation

## 2023-04-26 DIAGNOSIS — R059 Cough, unspecified: Secondary | ICD-10-CM | POA: Insufficient documentation

## 2023-04-26 DIAGNOSIS — R0602 Shortness of breath: Secondary | ICD-10-CM | POA: Insufficient documentation

## 2023-04-26 DIAGNOSIS — R0989 Other specified symptoms and signs involving the circulatory and respiratory systems: Secondary | ICD-10-CM | POA: Insufficient documentation

## 2023-04-26 DIAGNOSIS — R197 Diarrhea, unspecified: Secondary | ICD-10-CM | POA: Insufficient documentation

## 2023-04-26 DIAGNOSIS — J111 Influenza due to unidentified influenza virus with other respiratory manifestations: Secondary | ICD-10-CM

## 2023-04-26 NOTE — ED Triage Notes (Signed)
 Pt c/o cough and runny nose x 3 days. Denies fevers.

## 2023-04-27 LAB — RESP PANEL BY RT-PCR (RSV, FLU A&B, COVID)  RVPGX2
Influenza A by PCR: NEGATIVE
Influenza B by PCR: NEGATIVE
Resp Syncytial Virus by PCR: NEGATIVE
SARS Coronavirus 2 by RT PCR: NEGATIVE

## 2023-04-27 MED ORDER — ONDANSETRON 4 MG PO TBDP
4.0000 mg | ORAL_TABLET | Freq: Once | ORAL | Status: AC
  Administered 2023-04-27: 4 mg via ORAL
  Filled 2023-04-27: qty 1

## 2023-04-27 NOTE — Discharge Instructions (Signed)
 You were seen in the today for your nausea and diarrhea. You likely have a viral illness that is causing your symptoms. You may use over the counter medications as needed for your symptoms. Follow up with your primary care doctor and return to the ER with any new severe symptoms.

## 2023-04-27 NOTE — ED Provider Notes (Signed)
 [facility] Provider Note   CSN: [id number] Arrival date & time: 04/26/23  2336     History  Chief Complaint  Patient presents with   Cough    Craig Coleman is a 68 y.o. male who presents with concern for cough runny nose x 3 days with couple episodes of diarrhea and associated nausea without fevers.  No known ill contacts.  Able to tolerate p.o., urinating normally.  No muscle soreness, neck pain, headache.  Patient is not immunocompromised.  HPI     Home Medications Prior to Admission medications   Medication Sig Start Date End Date Taking? Authorizing Provider  amLODipine (NORVASC) 5 MG tablet Take 5 mg by mouth daily.    [provider]  hydrocortisone  cream 1 % Apply to affected area 2 times daily 03/16/23   Dreama, [place]  N, FNP  omeprazole  (PRILOSEC) 20 MG capsule Take 1 capsule (20 mg total) by mouth daily. 07/05/21   Banister, Pamela K, MD      Allergies    Ezetimibe, Fenofibrate, Azithromycin, Doxycycline, Ibuprofen , and Tramadol    Review of Systems   Review of Systems  Constitutional:  Positive for activity change, appetite change, chills and fatigue.  HENT:  Positive for congestion.   Respiratory:  Positive for cough.   Gastrointestinal:  Positive for diarrhea and nausea. Negative for vomiting.  Genitourinary:  Negative for decreased urine volume.  Neurological: Negative.     Physical Exam Updated Vital Signs BP 132/85 (BP Location: Left Arm)   Pulse 68   Temp 98.3 F (36.8 C) (Oral)   Resp 16   Ht 6' 3 (1.905 m)   Wt 108.4 kg   SpO2 98%   BMI 29.87 kg/m  Physical Exam Vitals and nursing note reviewed.  Constitutional:      Appearance: He is not ill-appearing or toxic-appearing.  HENT:     Head: Normocephalic and atraumatic.     Mouth/Throat:     Mouth: Mucous membranes are moist.     Pharynx: No oropharyngeal exudate or posterior oropharyngeal erythema.  Eyes:     General:         Right eye: No discharge.        Left eye: No discharge.     Conjunctiva/sclera: Conjunctivae normal.  Cardiovascular:     Rate and Rhythm: Normal rate and regular rhythm.     Pulses: Normal pulses.     Heart sounds: Normal heart sounds. No murmur heard. Pulmonary:     Effort: Pulmonary effort is normal. No respiratory distress.     Breath sounds: Normal breath sounds. No wheezing or rales.  Abdominal:     General: Bowel sounds are normal. There is no distension.     Palpations: Abdomen is soft.     Tenderness: There is no abdominal tenderness. There is no guarding or rebound.  Musculoskeletal:        General: No deformity.     Cervical back: Neck supple.     Right lower leg: No edema.     Left lower leg: No edema.  Skin:    General: Skin is warm and dry.     Capillary Refill: Capillary refill takes less than 2 seconds.  Neurological:     General: No focal deficit present.     Mental Status: He is alert and oriented to person, place, and time. Mental status is at baseline.  Psychiatric:        Mood and Affect: Mood  normal.     ED Results / Procedures / Treatments   Labs (all labs ordered are listed, but only abnormal results are displayed) Labs Reviewed  RESP PANEL BY RT-PCR (RSV, FLU A&B, COVID)  [id number]    EKG None  Radiology DG Chest 2 View Result Date: 04/27/2023 CLINICAL DATA:  Cough and shortness of breath. EXAM: CHEST - 2 VIEW COMPARISON:  12/02/2021 FINDINGS: The heart is normal in size. Stable mediastinal contours with aortic atherosclerosis and tortuosity. No focal airspace disease, pleural effusion, pulmonary edema or pneumothorax. No acute osseous findings. IMPRESSION: No acute chest findings. Aortic atherosclerosis. Electronically Signed   By: Andrea Gasman M.D.   On: 04/27/2023 00:29    Procedures Procedures    Medications Ordered in ED Medications  ondansetron  (ZOFRAN -ODT) disintegrating tablet 4 mg (4 mg Oral Given 04/27/23 0515)    ED Course/  Medical Decision Making/ A&P                                 Medical Decision Making Amount and/or Complexity of Data Reviewed Labs:     Details: RVP unremarkable.   Radiology: ordered.    Details: CXR Negative for acute cardiopulmonary disease.   Risk Prescription drug management.   Clinical picture most consistent with acute viral syndrome.  Recommend supportive care with over-the-counter medications at home.  Craig Coleman voiced understanding of her medical evaluation and treatment plan. Each of their questions answered to their expressed satisfaction.  Return precautions were given.  Patient is well-appearing, stable, and was discharged in good condition.  This chart was dictated using voice recognition software, Dragon. Despite the best efforts of this provider to proofread and correct errors, errors may still occur which can change documentation meaning.         Final Clinical Impression(s) / ED Diagnoses Final diagnoses:  Influenza-like illness    Rx / DC Orders ED Discharge Orders     None         Bobette Pleasant JONELLE DEVONNA 04/27/23 0650    Lorette Mayo, MD 04/28/23 [phone]
